# Patient Record
Sex: Male | Born: 1985
Health system: Southern US, Community
[De-identification: ages and names within clinical notes are randomized; demographics above are authoritative.]

## PROBLEM LIST (undated history)

## (undated) DIAGNOSIS — E785 Hyperlipidemia, unspecified: Secondary | ICD-10-CM

## (undated) DIAGNOSIS — E669 Obesity, unspecified: Secondary | ICD-10-CM

## (undated) DIAGNOSIS — F419 Anxiety disorder, unspecified: Secondary | ICD-10-CM

## (undated) DIAGNOSIS — F329 Major depressive disorder, single episode, unspecified: Secondary | ICD-10-CM

## (undated) DIAGNOSIS — I10 Essential (primary) hypertension: Secondary | ICD-10-CM

## (undated) DIAGNOSIS — F32A Depression, unspecified: Secondary | ICD-10-CM

## (undated) HISTORY — PX: OTHER SURGICAL HISTORY: SHX169

## (undated) HISTORY — PX: VASECTOMY: SHX75

## (undated) HISTORY — DX: Anxiety disorder, unspecified: F41.9

## (undated) HISTORY — DX: Depression, unspecified: F32.A

## (undated) HISTORY — DX: Obesity, unspecified: E66.9

## (undated) HISTORY — DX: Essential (primary) hypertension: I10

## (undated) HISTORY — DX: Hyperlipidemia, unspecified: E78.5

## (undated) HISTORY — DX: Major depressive disorder, single episode, unspecified: F32.9

---
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
## 2011-10-16 ENCOUNTER — Other Ambulatory Visit: Payer: Self-pay

## 2011-10-16 ENCOUNTER — Emergency Department (HOSPITAL_COMMUNITY)
Admission: EM | Admit: 2011-10-16 | Discharge: 2011-10-16 | Disposition: A | Discharge status: Home / Self Care | Payer: BC Managed Care – PPO | Attending: Emergency Medicine | Admitting: Emergency Medicine

## 2011-10-16 ENCOUNTER — Emergency Department (HOSPITAL_COMMUNITY): Payer: BC Managed Care – PPO

## 2011-10-16 ENCOUNTER — Encounter (HOSPITAL_COMMUNITY): Payer: Self-pay | Admitting: *Deleted

## 2011-10-16 DIAGNOSIS — IMO0002 Reserved for concepts with insufficient information to code with codable children: Secondary | ICD-10-CM | POA: Insufficient documentation

## 2011-10-16 DIAGNOSIS — W208XXA Other cause of strike by thrown, projected or falling object, initial encounter: Secondary | ICD-10-CM | POA: Insufficient documentation

## 2011-10-16 DIAGNOSIS — S29011A Strain of muscle and tendon of front wall of thorax, initial encounter: Secondary | ICD-10-CM

## 2011-10-16 DIAGNOSIS — R0602 Shortness of breath: Secondary | ICD-10-CM | POA: Insufficient documentation

## 2011-10-16 DIAGNOSIS — M546 Pain in thoracic spine: Secondary | ICD-10-CM | POA: Insufficient documentation

## 2011-10-16 DIAGNOSIS — R079 Chest pain, unspecified: Secondary | ICD-10-CM | POA: Insufficient documentation

## 2011-10-16 MED ORDER — HYDROCODONE-ACETAMINOPHEN 5-500 MG PO TABS: 1.0000 | ORAL_TABLET | Freq: Four times a day (QID) | ORAL | Status: DC | PRN

## 2011-10-16 MED ORDER — CYCLOBENZAPRINE HCL 10 MG PO TABS: 10.0000 mg | ORAL_TABLET | Freq: Two times a day (BID) | ORAL | Status: DC | PRN

## 2011-10-16 MED ORDER — IBUPROFEN 800 MG PO TABS: 800.0000 mg | ORAL_TABLET | Freq: Three times a day (TID) | ORAL | Status: DC

## 2011-10-16 NOTE — ED Notes (Signed)
EKG done and shown to Dr. Effie Shy upon arrival

## 2011-10-16 NOTE — ED Notes (Signed)
Patient complaining of chest pain and shortness of breath; taken to EKG room to perform EKG.

## 2011-10-16 NOTE — ED Provider Notes (Signed)
History     CSN: [id number]  Arrival date & time 10/16/11  1947   First MD Initiated Contact with Patient 10/16/11 2115      Chief Complaint  Patient presents with  . Shortness of Breath    (Consider location/radiation/quality/duration/timing/severity/associated sxs/prior treatment) Patient is a 26 y.o. male presenting with back pain. The history is provided by the patient and the spouse.  Back Pain  This is a new problem. The current episode started 3 to 5 hours ago. The problem occurs constantly. The problem has been gradually improving. Associated with: car falling on patient. The pain is present in the thoracic spine. The pain does not radiate. The pain is at a severity of 2/10. The pain is mild. The symptoms are aggravated by certain positions. The pain is the same all the time. Pertinent negatives include no chest pain, no fever, no numbness, no abdominal pain, no abdominal swelling, no bowel incontinence, no bladder incontinence and no weakness. He has tried nothing for the symptoms.    History reviewed. No pertinent past medical history.  History reviewed. No pertinent past surgical history.  History reviewed. No pertinent family history.  History  Substance Use Topics  . Smoking status: Never Smoker   . Smokeless tobacco: Not on file  . Alcohol Use: No      Review of Systems  Constitutional: Negative for fever and chills.  HENT: Negative for congestion.   Respiratory: Negative for cough and shortness of breath.   Cardiovascular: Negative for chest pain.  Gastrointestinal: Negative for nausea, vomiting, abdominal pain, constipation, blood in stool and bowel incontinence.  Genitourinary: Negative for bladder incontinence, decreased urine volume and difficulty urinating.  Musculoskeletal: Positive for back pain.  Skin: Negative for color change, pallor and wound.  Neurological: Negative for weakness and numbness.  Psychiatric/Behavioral: Negative for confusion.    All other systems reviewed and are negative.    Allergies  Review of patient's allergies indicates no known allergies.  Home Medications   Current Outpatient Rx  Name Route Sig Dispense Refill  . IBUPROFEN 200 MG PO TABS Oral Take 400 mg by mouth every 6 (six) hours as needed. For pain.      BP 124/93  Pulse 90  Temp(Src) 98.2 F (36.8 C) (Oral)  Resp 20  SpO2 98%  Physical Exam  Nursing note and vitals reviewed. Constitutional: He is oriented to person, place, and time. He appears well-developed and well-nourished.  HENT:  Head: Normocephalic and atraumatic.  Right Ear: External ear normal.  Left Ear: External ear normal.  Nose: Nose normal.  Neck: Neck supple.  Cardiovascular: Normal rate, regular rhythm, normal heart sounds and intact distal pulses.   Pulmonary/Chest: Effort normal and breath sounds normal. No respiratory distress. He has no wheezes. He has no rales. He exhibits no tenderness.  Abdominal: Soft. He exhibits no distension and no mass. There is no tenderness. There is no rebound and no guarding.  Musculoskeletal: He exhibits no edema.       Thoracic back: He exhibits tenderness (mild midline and bilateral soft tissue tenderness).  Lymphadenopathy:    He has no cervical adenopathy.  Neurological: He is alert and oriented to person, place, and time. He has normal strength. No sensory deficit.  Skin: Skin is warm and dry. No bruising and no ecchymosis noted.    ED Course  Procedures (including critical care time)  Labs Reviewed - No data to display Dg Chest 2 View  10/16/2011  *RADIOLOGY REPORT*  Clinical  Data: Shortness of breath  CHEST - 2 VIEW  Comparison: None.  Findings: Normal heart, mediastinal, and hilar contours.  The trachea is midline.  Lung volumes are slightly low.  The lungs are clear.  Negative for pleural effusion or pneumothorax.  The bony thorax is within normal limits.  IMPRESSION: No acute cardiopulmonary disease.  Original Report  Authenticated By: Britta Mccreedy, M.D.     1. Strain of chest wall   2. Back pain, thoracic       MDM  26 yo male presents after a car on a jack fell on top of his right abd and left chest. On him for a few min until wife could lift it off. Initially dyspnea and CP, but now those have resolved, only has midline thoracic pain. Pain has decreased just with time, and now is only a 2/10. Likely a strain, as mechanism is not c/w thoracic fracture. Chest, abd exam negative for tenderness or bruising. On my view of lateral CXR thoracic spine looks intact. Will d/c home with pain meds, precautions and follow up with PCP.        Pricilla Loveless, MD 10/16/11 [phone]

## 2011-10-16 NOTE — ED Notes (Signed)
The pt was working on his car lodged on some wood and the car  [place] onto his chest he was trapped until his wife came out and wound the car jack back up.  C/o chest back posterior chest  Pain since.  At present no acute distress

## 2011-10-16 NOTE — ED Notes (Signed)
Wife in waiting room asking about patient; patient informed that he can sit out in waiting room with wife while waiting on a room.  Will continue to monitor.

## 2011-10-17 NOTE — ED Provider Notes (Signed)
I saw and evaluated the patient, reviewed the resident's note and I agree with the findings and plan. Pt had car jack fail, vehicle briefly rested on chest wall. Breathing comfortable. Mild chest wall tenderness without crepitus. cxr neg. Spine nt.   Suzi Roots, MD 10/17/11 [phone]

## 2013-05-24 ENCOUNTER — Ambulatory Visit: Payer: Self-pay

## 2013-06-23 ENCOUNTER — Telehealth: Payer: Self-pay | Admitting: Nurse Practitioner

## 2013-06-24 ENCOUNTER — Encounter: Payer: Self-pay | Admitting: Family Medicine

## 2013-06-24 NOTE — Progress Notes (Signed)
  Subjective:    Patient ID: Todd Sandoval Comment, male    DOB: July 21, 1986, 27 y.o.   MRN: [id number]  HPI    Review of Systems     Objective:   Physical Exam        Assessment & Plan:

## 2013-09-22 ENCOUNTER — Encounter (INDEPENDENT_AMBULATORY_CARE_PROVIDER_SITE_OTHER): Payer: Self-pay

## 2013-09-22 ENCOUNTER — Encounter: Payer: Self-pay | Admitting: Physician Assistant

## 2013-09-22 ENCOUNTER — Ambulatory Visit (INDEPENDENT_AMBULATORY_CARE_PROVIDER_SITE_OTHER): Payer: BC Managed Care – PPO | Admitting: Physician Assistant

## 2013-09-22 VITALS — BP 131/87 | HR 115 | Temp 102.4°F | Ht 68.0 in | Wt 246.0 lb

## 2013-09-22 DIAGNOSIS — R059 Cough, unspecified: Secondary | ICD-10-CM

## 2013-09-22 DIAGNOSIS — R05 Cough: Secondary | ICD-10-CM

## 2013-09-22 LAB — POCT INFLUENZA A/B
Influenza A, POC: POSITIVE
Influenza B, POC: NEGATIVE

## 2013-09-22 MED ORDER — OSELTAMIVIR PHOSPHATE 75 MG PO CAPS: 75.0000 mg | ORAL_CAPSULE | Freq: Two times a day (BID) | ORAL | Status: DC

## 2013-09-22 MED ORDER — HYDROCODONE-HOMATROPINE 5-1.5 MG PO TABS: ORAL_TABLET | ORAL | Status: DC

## 2013-09-22 NOTE — Progress Notes (Signed)
   Subjective:    Patient ID: Todd Sandoval Commenthristopher A Lottes, male    DOB: 04/20/1986, 28 y.o.   MRN: [id number]  Cough Associated symptoms include chills, a fever, postnasal drip and a sore throat. Pertinent negatives include no shortness of breath or wheezing.  Fever  Associated symptoms include congestion, coughing (nonproductive) and a sore throat. Pertinent negatives include no wheezing.   28 y/o male presents with 2 days s/s of non productive cough, nasal congestion, fever and HA x 2 days. Has tried Theraflu with no relief. No known sick contacts.      Review of Systems  Constitutional: Positive for fever, chills, diaphoresis and fatigue.  HENT: Positive for congestion, postnasal drip, sinus pressure, sneezing and sore throat.   Respiratory: Positive for cough (nonproductive). Negative for shortness of breath and wheezing.   Cardiovascular: Negative.        Objective:   Physical Exam  Nursing note and vitals reviewed. Constitutional: He is oriented to person, place, and time. He appears well-developed and well-nourished.  HENT:  Right Ear: External ear normal.  Left Ear: External ear normal.  Bilateral nasal congestion   Cardiovascular: Normal rate, regular rhythm and normal heart sounds.   Pulmonary/Chest: Effort normal and breath sounds normal. No respiratory distress. He has no wheezes. He has no rales. He exhibits no tenderness.  Nonproductive cough  Neurological: He is alert and oriented to person, place, and time.  Skin: Skin is warm.          Assessment & Plan:  1. Flu A Positive: Patient has been symptomatic for over 48 hours so I discussed the possibility with him that the Tamiflu may not be effective d/t timeframe since onset. If the Patient is unsure of his insurance coverage so I have advised him to begin taking it if affordable. However, if not, he will need to treat symptomatically with Hycodan q 4-6 hrs and sudafed decongestant. Tylenol and Motrin for fever as  directed. Plenty of fluids and rest. RTC if SOB or s/s worsen.

## 2015-04-24 ENCOUNTER — Encounter: Payer: Self-pay | Admitting: Family Medicine

## 2015-04-24 ENCOUNTER — Ambulatory Visit (INDEPENDENT_AMBULATORY_CARE_PROVIDER_SITE_OTHER): Payer: BLUE CROSS/BLUE SHIELD | Admitting: Family Medicine

## 2015-04-24 ENCOUNTER — Encounter (INDEPENDENT_AMBULATORY_CARE_PROVIDER_SITE_OTHER): Payer: Self-pay

## 2015-04-24 VITALS — BP 136/84 | HR 84 | Temp 97.7°F | Ht 67.0 in | Wt 268.0 lb

## 2015-04-24 DIAGNOSIS — Z0000 Encounter for general adult medical examination without abnormal findings: Secondary | ICD-10-CM | POA: Diagnosis not present

## 2015-04-24 DIAGNOSIS — R0683 Snoring: Secondary | ICD-10-CM

## 2015-04-24 MED ORDER — PHENTERMINE HCL 37.5 MG PO CAPS: 37.5000 mg | ORAL_CAPSULE | ORAL | Status: DC

## 2015-04-24 NOTE — Progress Notes (Signed)
Subjective:  Patient ID: Todd Sandoval, male    DOB: 11-13-1985  Age: 29 y.o. MRN: [id number]  CC: Annual Exam   HPI Todd Sandoval presents for complete physical examination. He is very healthy overall but his father and grandfather both died of myocardial infarction. Father at age 30 grandfather at age 29. Patient is interested in managing his risk. History Todd Sandoval has a past medical history of Obesity.   He has past surgical history that includes none.   His family history includes Early death in his father and paternal grandfather; Heart disease in his father and paternal grandfather; Hyperlipidemia in his father.He reports that he has never smoked. He does not have any smokeless tobacco history on file. He reports that he does not drink alcohol. His drug history is not on file.    ROS Review of Systems  Constitutional: Negative for fever, chills, diaphoresis, activity change, appetite change, fatigue and unexpected weight change.  HENT: Negative for congestion, ear pain, hearing loss, postnasal drip, rhinorrhea, sore throat, tinnitus and trouble swallowing.   Eyes: Negative for photophobia, pain, discharge and redness.  Respiratory: Negative for apnea, cough, choking, chest tightness, shortness of breath, wheezing and stridor.   Cardiovascular: Negative for chest pain, palpitations and leg swelling.  Gastrointestinal: Negative for nausea, vomiting, abdominal pain, diarrhea, constipation, blood in stool and abdominal distention.  Endocrine: Negative for cold intolerance, heat intolerance, polydipsia, polyphagia and polyuria.  Genitourinary: Negative for dysuria, urgency, frequency, hematuria, flank pain, enuresis, difficulty urinating and genital sores.  Musculoskeletal: Negative for joint swelling and arthralgias.  Skin: Negative for color change, rash and wound.  Allergic/Immunologic: Negative for immunocompromised state.  Neurological: Negative for dizziness,  tremors, seizures, syncope, facial asymmetry, speech difficulty, weakness, light-headedness, numbness and headaches.  Hematological: Does not bruise/bleed easily.  Psychiatric/Behavioral: Negative for suicidal ideas, hallucinations, behavioral problems, confusion, sleep disturbance, dysphoric mood, decreased concentration and agitation. The patient is not nervous/anxious and is not hyperactive.     Objective:  BP 136/84 mmHg  Pulse 84  Temp(Src) 97.7 F (36.5 C) (Oral)  Ht $R'5\' 7"'rP$  (1.702 m)  Wt 268 lb (121.564 kg)  BMI 41.96 kg/m2  BP Readings from Last 3 Encounters:  04/24/15 136/84  09/22/13 131/87  10/16/11 141/88    Wt Readings from Last 3 Encounters:  04/24/15 268 lb (121.564 kg)  09/22/13 246 lb (111.585 kg)     Physical Exam  Constitutional: He is oriented to person, place, and time. He appears well-developed and well-nourished.  HENT:  Head: Normocephalic and atraumatic.  Mouth/Throat: Oropharynx is clear and moist.  Eyes: EOM are normal. Pupils are equal, round, and reactive to light.  Neck: Normal range of motion. No tracheal deviation present. No thyromegaly present.  Cardiovascular: Normal rate, regular rhythm and normal heart sounds.  Exam reveals no gallop and no friction rub.   No murmur heard. Pulmonary/Chest: Breath sounds normal. He has no wheezes. He has no rales.  Abdominal: Soft. He exhibits no mass. There is no tenderness.  Musculoskeletal: Normal range of motion. He exhibits no edema.  Neurological: He is alert and oriented to person, place, and time.  Skin: Skin is warm and dry.  Psychiatric: He has a normal mood and affect.    No results found for: HGBA1C  No results found for: WBC, HGB, HCT, PLT, GLUCOSE, CHOL, TRIG, HDL, LDLDIRECT, LDLCALC, ALT, AST, NA, K, CL, CREATININE, BUN, CO2, TSH, PSA, INR, GLUF, HGBA1C, MICROALBUR       Assessment & Plan:  Todd Sandoval was seen today for annual exam.  Diagnoses and all orders for this  visit:  Snoring -     Ambulatory referral to Sleep Studies  Wellness examination -     CBC with Differential/Platelet -     CMP14+EGFR -     Lipid panel -     TSH  Other orders -     phentermine 37.5 MG capsule; Take 1 capsule (37.5 mg total) by mouth every morning.   I have discontinued Mr. Kotowski Hydrocodone-Homatropine and oseltamivir. I am also having him start on phentermine. Additionally, I am having him maintain his multivitamin with minerals.  Meds ordered this encounter  Medications  . Multiple Vitamins-Minerals (MULTIVITAMIN WITH MINERALS) tablet    Sig: Take 1 tablet by mouth daily.  . phentermine 37.5 MG capsule    Sig: Take 1 capsule (37.5 mg total) by mouth every morning.    Dispense:  30 capsule    Refill:  2     Follow-up: Return in about 3 months (around 07/24/2015) for weight.  Claretta Fraise, M.D.

## 2015-04-24 NOTE — Patient Instructions (Signed)

## 2015-04-25 LAB — CMP14+EGFR
ALK PHOS: 62 IU/L (ref 39–117)
ALT: 82 IU/L — AB (ref 0–44)
AST: 33 IU/L (ref 0–40)
Albumin/Globulin Ratio: 1.4 (ref 1.1–2.5)
Albumin: 4.1 g/dL (ref 3.5–5.5)
BUN/Creatinine Ratio: 18 (ref 8–19)
BUN: 14 mg/dL (ref 6–20)
Bilirubin Total: 0.7 mg/dL (ref 0.0–1.2)
CALCIUM: 9.4 mg/dL (ref 8.7–10.2)
CO2: 23 mmol/L (ref 18–29)
CREATININE: 0.76 mg/dL (ref 0.76–1.27)
Chloride: 99 mmol/L (ref 97–108)
GFR calc Af Amer: 145 mL/min/{1.73_m2} (ref >59)
GFR, EST NON AFRICAN AMERICAN: 125 mL/min/{1.73_m2} (ref >59)
GLUCOSE: 104 mg/dL — AB (ref 65–99)
Globulin, Total: 2.9 g/dL (ref 1.5–4.5)
Potassium: 4.3 mmol/L (ref 3.5–5.2)
Sodium: 139 mmol/L (ref 134–144)
Total Protein: 7 g/dL (ref 6.0–8.5)

## 2015-04-25 LAB — LIPID PANEL
CHOL/HDL RATIO: 5.9 ratio — AB (ref 0.0–5.0)
CHOLESTEROL TOTAL: 190 mg/dL (ref 100–199)
HDL: 32 mg/dL — AB (ref >39)
LDL CALC: 123 mg/dL — AB (ref 0–99)
TRIGLYCERIDES: 173 mg/dL — AB (ref 0–149)
VLDL CHOLESTEROL CAL: 35 mg/dL (ref 5–40)

## 2015-04-25 LAB — CBC WITH DIFFERENTIAL/PLATELET
BASOS ABS: 0.1 10*3/uL (ref 0.0–0.2)
Basos: 1 %
EOS (ABSOLUTE): 0.6 10*3/uL — ABNORMAL HIGH (ref 0.0–0.4)
EOS: 10 %
Hematocrit: 45.5 % (ref 37.5–51.0)
Hemoglobin: 16.1 g/dL (ref 12.6–17.7)
Immature Grans (Abs): 0 10*3/uL (ref 0.0–0.1)
Immature Granulocytes: 0 %
LYMPHS ABS: 2.3 10*3/uL (ref 0.7–3.1)
Lymphs: 38 %
MCH: 30.6 pg (ref 26.6–33.0)
MCHC: 35.4 g/dL (ref 31.5–35.7)
MCV: 86 fL (ref 79–97)
MONOCYTES: 9 %
MONOS ABS: 0.5 10*3/uL (ref 0.1–0.9)
NEUTROS PCT: 42 %
Neutrophils Absolute: 2.6 10*3/uL (ref 1.4–7.0)
Platelets: 260 10*3/uL (ref 150–379)
RBC: 5.27 x10E6/uL (ref 4.14–5.80)
RDW: 13.4 % (ref 12.3–15.4)
WBC: 6.1 10*3/uL (ref 3.4–10.8)

## 2015-04-25 LAB — TSH: TSH: 2.13 u[IU]/mL (ref 0.450–4.500)

## 2015-04-26 ENCOUNTER — Encounter: Payer: Self-pay | Admitting: *Deleted

## 2015-04-30 ENCOUNTER — Telehealth: Payer: Self-pay

## 2015-04-30 NOTE — Telephone Encounter (Signed)
Insurance approved Phentermine through 07/24/15

## 2015-07-30 ENCOUNTER — Ambulatory Visit: Payer: BLUE CROSS/BLUE SHIELD | Admitting: Family Medicine

## 2015-08-01 ENCOUNTER — Encounter: Payer: Self-pay | Admitting: Family Medicine

## 2017-01-07 ENCOUNTER — Encounter: Payer: Self-pay | Admitting: Family Medicine

## 2017-01-07 ENCOUNTER — Ambulatory Visit (INDEPENDENT_AMBULATORY_CARE_PROVIDER_SITE_OTHER): Payer: BLUE CROSS/BLUE SHIELD | Admitting: Family Medicine

## 2017-01-07 VITALS — BP 129/88 | HR 100 | Temp 97.9°F | Ht 67.0 in | Wt 275.2 lb

## 2017-01-07 DIAGNOSIS — Z0000 Encounter for general adult medical examination without abnormal findings: Secondary | ICD-10-CM | POA: Diagnosis not present

## 2017-01-07 DIAGNOSIS — F339 Major depressive disorder, recurrent, unspecified: Secondary | ICD-10-CM

## 2017-01-07 MED ORDER — BUPROPION HCL ER (SR) 150 MG PO TB12: 150.0000 mg | ORAL_TABLET | Freq: Two times a day (BID) | ORAL | 2 refills | Status: DC

## 2017-01-07 NOTE — Progress Notes (Signed)
   HPI  Patient presents today here for an annual physical exam and to discuss depression.  Depression 2 years approximately, after his father's death. Has periods of crying spells, anhedonia, feelings of depression. Has had passive suicidal thoughts, contracts for safety and states that he would never hurt himself.  Patient has recently started working out multiple times a week. There are also watching his diet carefully. He has regular lab work done at work.  He feels well today and has no other complaints except for his mood.  PMH: Smoking status noted ROS: Per HPI  Objective: BP 129/88   Pulse 100   Temp 97.9 F (36.6 C) (Oral)   Ht 5\' 7"  (1.702 m)   Wt 275 lb 3.2 oz (124.8 kg)   BMI 43.10 kg/m  Gen: NAD, alert, cooperative with exam HEENT: NCAT CV: RRR, good S1/S2, no murmur Resp: CTABL, no wheezes, non-labored Abd: SNTND, BS present, no guarding or organomegaly Ext: No edema, warm Neuro: Alert and oriented, No gross deficits Psych:  Depressed mood, tearful during exam, depressed affect  Depression screen [facility] 2/9 01/07/2017 04/24/2015  Decreased Interest 2 1  Down, Depressed, Hopeless 2 1  PHQ - 2 Score 4 2  Altered sleeping 1 0  Tired, decreased energy 2 0  Change in appetite 1 0  Feeling bad or failure about yourself  2 0  Trouble concentrating 1 0  Moving slowly or fidgety/restless 0 0  Suicidal thoughts 1 0  PHQ-9 Score 12 2     Assessment and plan:  # Annual physical exam Normal exam except for obesity, patient has made positive therapeutic lifestyle changes recently. Congratulated on that. Follow-up 3-4 weeks Pt will bring in labs  # And depression Start Wellbutrin Recommended counseling, he will consider Follow-up in 3-4 weeks     Meds ordered this encounter  Medications  . buPROPion (WELLBUTRIN SR) 150 MG 12 hr tablet    Sig: Take 1 tablet (150 mg total) by mouth 2 (two) times daily.    Dispense:  60 tablet    Refill:  2    Murtis SinkSam  Dezarae Mcclaran, MD [facility] 01/07/2017, 2:58 PM

## 2017-01-07 NOTE — Patient Instructions (Signed)
Great to see yoU!  Start wellbutrin 1 pill daily for 3 days then 1 pill twice daily.   Come back in 3-4 weeks to check up

## 2017-05-11 ENCOUNTER — Other Ambulatory Visit: Payer: Self-pay | Admitting: Family Medicine

## 2017-05-11 MED ORDER — AMOXICILLIN 500 MG PO CAPS: 500.0000 mg | ORAL_CAPSULE | Freq: Two times a day (BID) | ORAL | 0 refills | Status: DC

## 2017-05-11 NOTE — Progress Notes (Signed)
Patient's daughter, Maxie Better, was diagnosed with strep pharyngitis today. He has an upcoming trip to the [place] via cruise and is worried that he may develop symptoms. He is currently completely asymptomatic. No known drug allergies. No history of recurrent strep throat. Amoxicillin 500 mg twice a day 10 days sent to his pharmacy to use should he develop symptoms during his cruise. Care was discussed with his primary care doctor Dr. Ermalinda Memos.  Pearle Wandler M. Nadine Counts, DO [facility]

## 2017-06-11 ENCOUNTER — Other Ambulatory Visit: Payer: Self-pay | Admitting: Family Medicine

## 2017-07-06 ENCOUNTER — Telehealth: Payer: Self-pay | Admitting: *Deleted

## 2017-07-06 NOTE — Telephone Encounter (Signed)
TC request RF Wellbutrin NTBS last OV 01/07/17 stated NTBS in about 4 wks Sent denial to [facility] as well

## 2017-07-07 ENCOUNTER — Telehealth: Payer: Self-pay

## 2017-07-07 ENCOUNTER — Other Ambulatory Visit: Payer: Self-pay

## 2017-07-07 MED ORDER — BUPROPION HCL ER (SR) 150 MG PO TB12: 150.0000 mg | ORAL_TABLET | Freq: Two times a day (BID) | ORAL | 0 refills | Status: DC

## 2017-07-07 NOTE — Telephone Encounter (Signed)
x

## 2017-07-16 ENCOUNTER — Other Ambulatory Visit: Payer: Self-pay | Admitting: Family Medicine

## 2017-07-29 ENCOUNTER — Other Ambulatory Visit: Payer: Self-pay | Admitting: *Deleted

## 2017-07-29 MED ORDER — BUPROPION HCL ER (SR) 150 MG PO TB12: 150.0000 mg | ORAL_TABLET | Freq: Two times a day (BID) | ORAL | 3 refills | Status: DC

## 2017-08-24 ENCOUNTER — Encounter: Payer: Self-pay | Admitting: Family Medicine

## 2017-08-24 ENCOUNTER — Ambulatory Visit: Payer: BLUE CROSS/BLUE SHIELD | Admitting: Family Medicine

## 2017-08-24 VITALS — BP 157/97 | HR 97 | Temp 98.2°F | Ht 67.0 in | Wt 281.2 lb

## 2017-08-24 DIAGNOSIS — M722 Plantar fascial fibromatosis: Secondary | ICD-10-CM

## 2017-08-24 MED ORDER — INDOMETHACIN 50 MG PO CAPS: 50.0000 mg | ORAL_CAPSULE | Freq: Two times a day (BID) | ORAL | 0 refills | Status: DC

## 2017-08-24 NOTE — Progress Notes (Signed)
   HPI  Patient presents today here for heel pain.  Patient states that he has had right heel pain for about 1 month. She states that it is worse after sitting down for a while.  He states that it hurts in the posterior part of this foot. 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  He has not tried medications, ice. He has tried heat without improvement  PMH: Smoking status noted ROS: Per HPI  Objective: BP (!) 157/97   Pulse 97   Temp 98.2 F (36.8 C) (Oral)   Ht 5\' 7"  (1.702 m)   Wt 281 lb 3.2 oz (127.6 kg)   BMI 44.04 kg/m  Gen: NAD, alert, cooperative with exam HEENT: NCAT CV: RRR, good S1/S2, no murmur Resp: CTABL, no wheezes, non-labored Ext: No edema, warm Neuro: Alert and oriented, No gross deficits  MSK: Tenderness to palpation in the right foot over the insertion of the right plantar fascia   Assessment and plan:  #Plantar fasciitis Treat conservatively Handout from sports medicine patient advisor given Indomethacin Return to clinic as needed  Meds ordered this encounter  Medications  . indomethacin (INDOCIN) 50 MG capsule    Sig: Take 1 capsule (50 mg total) by mouth 2 (two) times daily with a meal.    Dispense:  28 capsule    Refill:  0    Murtis SinkSam Richel Millspaugh, MD [facility] 08/24/2017, 4:45 PM

## 2017-08-24 NOTE — Patient Instructions (Signed)
Great to see you!  Read through the handout I gave you, the medicine, ice, and stretching are the most effective things you can try.

## 2017-09-04 ENCOUNTER — Other Ambulatory Visit: Payer: Self-pay | Admitting: Family Medicine

## 2017-09-20 ENCOUNTER — Other Ambulatory Visit: Payer: Self-pay | Admitting: Family Medicine

## 2017-09-29 ENCOUNTER — Ambulatory Visit: Payer: BLUE CROSS/BLUE SHIELD | Admitting: Nurse Practitioner

## 2017-09-29 ENCOUNTER — Encounter: Payer: Self-pay | Admitting: Nurse Practitioner

## 2017-09-29 VITALS — BP 104/68 | HR 106 | Temp 96.4°F | Ht 67.0 in | Wt 269.0 lb

## 2017-09-29 DIAGNOSIS — K529 Noninfective gastroenteritis and colitis, unspecified: Secondary | ICD-10-CM | POA: Diagnosis not present

## 2017-09-29 DIAGNOSIS — R52 Pain, unspecified: Secondary | ICD-10-CM | POA: Diagnosis not present

## 2017-09-29 LAB — VERITOR FLU A/B WAIVED
INFLUENZA A: NEGATIVE
INFLUENZA B: NEGATIVE

## 2017-09-29 MED ORDER — ONDANSETRON 4 MG PO TBDP: 4.0000 mg | ORAL_TABLET | Freq: Three times a day (TID) | ORAL | 0 refills | Status: DC | PRN

## 2017-09-29 NOTE — Progress Notes (Signed)
   Subjective:    Patient ID: Todd Sandoval, male    DOB: 10/30/1985, 32 y.o.   MRN: [id number]  HPI  Patient comes in today c/o vomiting and diarrhea. Started all the suddden at 3am. Can't keep anything down. Feels terrible. He has voided everytime he had diarrhea today.   Review of Systems  Constitutional: Appetite change: decreased.  HENT: Negative.   Cardiovascular: Negative.   Gastrointestinal: Positive for diarrhea, nausea and vomiting.  Genitourinary: Negative.   Neurological: Negative.   Psychiatric/Behavioral: Negative.   All other systems reviewed and are negative.      Objective:   Physical Exam  Constitutional: He is oriented to person, place, and time. He appears well-developed and well-nourished. He appears distressed (moderate).  HENT:  Mouth/Throat: Oropharynx is clear and moist.  Cardiovascular: Normal rate and regular rhythm.  Pulmonary/Chest: Effort normal and breath sounds normal.  Abdominal: Soft. Bowel sounds are normal.  Neurological: He is alert and oriented to person, place, and time.  Skin: There is pallor.  Psychiatric: He has a normal mood and affect. His behavior is normal. Judgment and thought content normal.   BP 104/68   Pulse (!) 106   Temp (!) 96.4 F (35.8 C) (Oral)   Ht 5\' 7"  (1.702 m)   Wt 269 lb (122 kg)   BMI 42.13 kg/m   Flu negative       Assessment & Plan:   1. Body aches   2. Gastroenteritis    Meds ordered this encounter  Medications  . ondansetron (ZOFRAN ODT) 4 MG disintegrating tablet    Sig: Take 1 tablet (4 mg total) by mouth every 8 (eight) hours as needed for nausea or vomiting.    Dispense:  20 tablet    Refill:  0    Order Specific Question:   Supervising Provider    Answer:   Oswaldo DoneVINCENT, CAROL L [[id number]]  take imodium about 20 min after taking zofran First 24 Hours-Clear liquids  popsicles  Jello  gatorade  Sprite Second 24 hours-Add Full liquids ( Liquids you cant see through) Third 24 hours- Bland  diet ( foods that are baked or broiled)  *avoiding fried foods and highly spiced foods* During these 3 days  Avoid milk, cheese, ice cream or any other dairy products  Avoid caffeine- REMEMBER Mt. Dew and Mello Yellow contain lots of caffeine You should eat and drink in  Frequent small volumes If no improvement in symptoms or worsen in 2-3 days should RETRUN TO OFFICE or go to ER!    Mary-Margaret Daphine DeutscherMartin, FNP

## 2017-09-29 NOTE — Patient Instructions (Signed)

## 2017-10-08 ENCOUNTER — Ambulatory Visit: Payer: BLUE CROSS/BLUE SHIELD | Admitting: Family Medicine

## 2017-10-20 ENCOUNTER — Encounter: Payer: Self-pay | Admitting: Family Medicine

## 2017-10-20 ENCOUNTER — Ambulatory Visit: Payer: BLUE CROSS/BLUE SHIELD | Admitting: Family Medicine

## 2017-10-20 VITALS — BP 132/78 | HR 86 | Temp 100.8°F | Ht 67.0 in | Wt 268.0 lb

## 2017-10-20 DIAGNOSIS — J020 Streptococcal pharyngitis: Secondary | ICD-10-CM

## 2017-10-20 LAB — RAPID STREP SCREEN (MED CTR MEBANE ONLY): Strep Gp A Ag, IA W/Reflex: POSITIVE — AB

## 2017-10-20 MED ORDER — AMOXICILLIN 500 MG PO TABS: 500.0000 mg | ORAL_TABLET | Freq: Two times a day (BID) | ORAL | 0 refills | Status: DC

## 2017-10-20 NOTE — Progress Notes (Signed)
BP 132/78   Pulse 86   Temp (!) 100.8 F (38.2 C) (Oral)   Ht 5\' 7"  (1.702 m)   Wt 268 lb (121.6 kg)   BMI 41.97 kg/m    Subjective:    Patient ID: Todd Sandoval, male    DOB: 02/02/1986, 32 y.o.   MRN: [id number]  HPI: Todd Sandoval is a 32 y.o. male presenting on 10/20/2017 for Sore throat, fever (sore throat since last week, fever began last night, 102.0) and Diarrhea, nauseous (since yesterday morning, noticed blood in stool twice; has tried Pepto Bismol and Immodium)   HPI Pt presents with sore throat and fever of 102F since last night. Also stated diarrhea and nausea began last night also. Denies any vomiting. Says he had 18-20 episodes of very watery diarrhea and 2 of them had blood in them. His fever is down to 100.67F today after taking Tylenol. Also has taken Pepto Bismol and Imodium which has seemed to calm down the diarrhea as it is slightly improving today. His wife and daughter were also sick with a similar gastrointestinal illness causing diarrhea and stomach upset a day prior to his illness after eating at [employer] that has resolved. He feels his course of illness is much worse.   Relevant past medical, surgical, family and social history reviewed and updated as indicated. Interim medical history since our last visit reviewed. Allergies and medications reviewed and updated.  Review of Systems  Constitutional: Positive for appetite change, chills, fatigue and fever.  HENT: Positive for sore throat. Negative for congestion, ear discharge, ear pain, rhinorrhea, sinus pressure and sinus pain.   Eyes: Negative for pain and itching.  Respiratory: Negative for cough, shortness of breath and wheezing.   Gastrointestinal: Positive for abdominal pain, blood in stool (2x since last night), diarrhea (18-20 episodes over last 24 hours) and nausea. Negative for constipation and vomiting.  Genitourinary: Negative for difficulty urinating, dysuria, flank pain and  frequency.  Neurological: Negative for headaches.    Per HPI unless specifically indicated above   Allergies as of 10/20/2017   No Known Allergies     Medication List        Accurate as of 10/20/17  2:51 PM. Always use your most recent med list.          amoxicillin 500 MG tablet Commonly known as:  AMOXIL Take 1 tablet (500 mg total) by mouth 2 (two) times daily.   buPROPion 150 MG 12 hr tablet Commonly known as:  WELLBUTRIN SR Take 1 tablet (150 mg total) by mouth 2 (two) times daily.          Objective:    BP 132/78   Pulse 86   Temp (!) 100.8 F (38.2 C) (Oral)   Ht 5\' 7"  (1.702 m)   Wt 268 lb (121.6 kg)   BMI 41.97 kg/m   Wt Readings from Last 3 Encounters:  10/20/17 268 lb (121.6 kg)  09/29/17 269 lb (122 kg)  08/24/17 281 lb 3.2 oz (127.6 kg)    Physical Exam  Constitutional: He appears well-developed and well-nourished.  HENT:  Nose: Nose normal.  Mouth/Throat: Uvula is midline and mucous membranes are normal. Posterior oropharyngeal edema and posterior oropharyngeal erythema present.  Cardiovascular: Normal rate, regular rhythm and normal heart sounds.  Pulmonary/Chest: Effort normal and breath sounds normal. No respiratory distress. He has no wheezes. He has no rales.  Abdominal: Soft. Normal appearance. There is tenderness (mild diffuse abdominal tenderness). There is no  rigidity and no rebound.  Lymphadenopathy:    He has cervical adenopathy.       Right cervical: Superficial cervical (small lymph node present; tenderness noted) adenopathy present.  Vitals reviewed.   Rapid strep: positive    Assessment & Plan:   Problem List Items Addressed This Visit    None    Visit Diagnoses    Strep pharyngitis    -  Primary   Relevant Medications   amoxicillin (AMOXIL) 500 MG tablet   Other Relevant Orders   Rapid Strep Screen (Not at [facility])      Order Amoxicillin for strep pharyngitis. Encourage fluid intake for diarrhea and call if symptoms  worsen.  Follow up plan: Return if symptoms worsen or fail to improve.  Counseling provided for all of the vaccine components Orders Placed This Encounter  Procedures  . Rapid Strep Screen (Not at [facility])    Patient seen and examined with Franco Collet, PA student. Agree with assessment and plan above.  Arville Care, MD [facility] 10/22/2017, 2:12 PM

## 2017-11-30 ENCOUNTER — Other Ambulatory Visit: Payer: Self-pay | Admitting: Family Medicine

## 2018-03-15 ENCOUNTER — Encounter: Payer: Self-pay | Admitting: Family Medicine

## 2018-03-15 ENCOUNTER — Ambulatory Visit (INDEPENDENT_AMBULATORY_CARE_PROVIDER_SITE_OTHER): Payer: BLUE CROSS/BLUE SHIELD | Admitting: Family Medicine

## 2018-03-15 VITALS — BP 135/84 | HR 92 | Temp 97.5°F | Ht 67.0 in | Wt 272.8 lb

## 2018-03-15 DIAGNOSIS — L821 Other seborrheic keratosis: Secondary | ICD-10-CM

## 2018-03-15 DIAGNOSIS — Z0000 Encounter for general adult medical examination without abnormal findings: Secondary | ICD-10-CM | POA: Diagnosis not present

## 2018-03-15 DIAGNOSIS — L819 Disorder of pigmentation, unspecified: Secondary | ICD-10-CM

## 2018-03-15 DIAGNOSIS — Z130 Encounter for screening for diseases of the blood and blood-forming organs and certain disorders involving the immune mechanism: Secondary | ICD-10-CM

## 2018-03-15 DIAGNOSIS — H6121 Impacted cerumen, right ear: Secondary | ICD-10-CM

## 2018-03-15 DIAGNOSIS — Z13228 Encounter for screening for other metabolic disorders: Secondary | ICD-10-CM

## 2018-03-15 DIAGNOSIS — Z808 Family history of malignant neoplasm of other organs or systems: Secondary | ICD-10-CM

## 2018-03-15 LAB — BAYER DCA HB A1C WAIVED: HB A1C (BAYER DCA - WAIVED): 5.3 % (ref <7.0)

## 2018-03-15 NOTE — Progress Notes (Signed)
Todd Sandoval is a 32 y.o. male presents to office today for annual physical exam examination.    Concerns today include: He needs forms completed for Todd Sandoval.   1. Skin lesions Patient is accompanied to the office by his wife, who notes that he has had multiple moles that are regular appearing.  She denies any change in shape, color, size.  No spontaneous bleeding but often whenever he is getting a haircut the one on his scalp does get cut and bleeds.  Patient does have a family history of skin cancer in his father.  He is unsure as to what kind of skin cancer but notes that he required resection.  He has not seen a dermatologist.  Occupation: Works on Scientist, research (medical) at [employer]; currently works night shift, Marital status: Married, Substance use: None Diet: Does eat quite a bit of vegetables rarely any red meat. Exercise: No structured but he does walk quite a bit at work. Immunizations needed: Up-to-date  Past Medical History:  Diagnosis Date  . Obesity    Social History   Socioeconomic History  . Marital status: Married    Spouse name: Not on file  . Number of children: 4  . Years of education: Not on file  . Highest education level: Not on file  Occupational History    Employer: [employer]    Comment: works on [place]  . Financial resource strain: Not on file  . Food insecurity:    Worry: Not on file    Inability: Not on file  . Transportation needs:    Medical: Not on file    Non-medical: Not on file  Tobacco Use  . Smoking status: Never Smoker  . Smokeless tobacco: Never Used  Substance and Sexual Activity  . Alcohol use: No  . Drug use: Not on file  . Sexual activity: Yes  Lifestyle  . Physical activity:    Days per week: Not on file    Minutes per session: Not on file  . Stress: Not on file  Relationships  . Social connections:    Talks on phone: Not on file    Gets together: Not on file    Attends religious  service: Not on file    Active member of club or organization: Not on file    Attends meetings of clubs or organizations: Not on file    Relationship status: Not on file  . Intimate partner violence:    Fear of current or ex partner: Not on file    Emotionally abused: Not on file    Physically abused: Not on file    Forced sexual activity: Not on file  Other Topics Concern  . Not on file  Social History Narrative  . Not on file   Past Surgical History:  Procedure Laterality Date  . none     Family History  Problem Relation Age of Onset  . Hyperlipidemia Father   . Early death Father   . Heart disease Father   . Skin cancer Father   . Early death Paternal Grandfather   . Heart disease Paternal Grandfather    No current outpatient medications on file.  No Known Allergies   ROS: Review of Systems Constitutional: negative Eyes: negative Ears, nose, mouth, throat, and face: negative Respiratory: negative Cardiovascular: negative Gastrointestinal: negative Genitourinary:negative Integument/breast: negative Hematologic/lymphatic: positive for multiple nevi/ moles Musculoskeletal:positive for occasional achy joints. Neurological: negative Behavioral/Psych: negative Endocrine: negative Allergic/Immunologic: negative  Physical exam BP 136/90   Pulse 92   Temp (!) 97.5 F (36.4 C) (Oral)   Ht '5\' 7"'$  (1.702 m)   Wt 272 lb 12.8 oz (123.7 kg)   BMI 42.73 kg/m  General appearance: alert, cooperative, appears stated age, no distress and mildly obese Head: Normocephalic, without obvious abnormality, atraumatic Eyes: negative findings: lids and lashes normal, conjunctivae and sclerae normal, corneas clear and pupils equal, round, reactive to light and accomodation Ears: right ear with cerumen obscuring TM. Left TM normal. Nose: Nares normal. Septum midline. Mucosa normal. No drainage or sinus tenderness. Throat: lips, mucosa, and tongue normal; teeth and gums  normal Neck: no adenopathy, supple, symmetrical, trachea midline and thyroid not enlarged, symmetric, no tenderness/mass/nodules Back: symmetric, no curvature. ROM normal. No CVA tenderness. Lungs: clear to auscultation bilaterally Chest wall: no tenderness Heart: regular rate and rhythm, S1, S2 normal, no murmur, click, rub or gallop Abdomen: soft, non-tender; bowel sounds normal; no masses,  no organomegaly Extremities: extremities normal, atraumatic, no cyanosis or edema Pulses: 2+ and symmetric Skin: Skin color, texture, turgor normal. No rashes or lesions or Multiple pigmented nevi appreciated along the back and neck.  He has several seborrheic keratosis along the right upper back.  He has a 4 mm x 4 mm roughly textured lesion appreciated along the right posterior scalp.  There is a similar lesion on the left nape of his neck posteriorly. Lymph nodes: Cervical, supraclavicular, and axillary nodes normal. Neurologic: Grossly normal  Psych: mood stable, speech normal. Depression screen [facility] 2/9 03/15/2018 10/20/2017 10/20/2017  Decreased Interest 0 0 0  Down, Depressed, Hopeless 0 1 1  PHQ - 2 Score 0 1 1  Altered sleeping - - -  Tired, decreased energy - - -  Change in appetite - - -  Feeling bad or failure about yourself  - - -  Trouble concentrating - - -  Moving slowly or fidgety/restless - - -  Suicidal thoughts - - -  PHQ-9 Score - - -    Assessment/ Plan: Todd Sandoval here for annual physical exam.   1. Annual physical exam  2. Morbid obesity ([place]) We discussed conscious eating, incorporating more physical activity.  Handout was provided.  I encouraged him to check out the my [email or web address] website.  We will continue to address with each visit.  I do think that night shift impacts his eating habits.  Hopefully once he is switched over to dayshift, this will improve.  Will check metabolic labs to rule out metabolic etiology. - TSH - CMP14+EGFR - Bayer DCA Hb A1c Waived -  Lipid Panel  3. Screening for deficiency anemia - CBC with Differential  4. Screening for metabolic disorder - MGQ67+YPPJ  5. Pigmented skin lesions Likely benign nevi but patient Fitzpatrick 1.5 on exam and with family history of unknown skin cancer would likely warrant referral to dermatology for skin survey. - Ambulatory referral to Dermatology  6. Seborrheic keratoses - Ambulatory referral to Dermatology  7. Family history of skin cancer - Ambulatory referral to Dermatology  8. Impacted cerumen of right ear Likely from use of hearing protection at work.  This was irrigated during today's visit.   Counseled on healthy lifestyle choices, including diet (rich in fruits, vegetables and lean meats and low in salt and simple carbohydrates) and exercise (at least 30 minutes of moderate physical activity daily).  Patient to follow up in 1 year for annual exam or sooner if needed.  Ashly M. Lajuana Ripple, DO

## 2018-03-15 NOTE — Patient Instructions (Addendum)
You had labs performed today.  You will be contacted with the results of the labs once they are available, usually in the next 3 business days for routine lab work.    Seborrheic Keratosis Seborrheic keratosis is a common, noncancerous (benign) skin growth. This condition causes waxy, rough, tan, brown, or black spots to appear on the skin. These skin growths can be flat or raised. What are the causes? The cause of this condition is not known. What increases the risk? This condition is more likely to develop in:  People who have a family history of seborrheic keratosis.  People who are 4550 or older.  People who are pregnant.  People who have had estrogen replacement therapy.  What are the signs or symptoms? This condition often occurs on the face, chest, shoulders, back, or other areas. These growths:  Are usually painless, but may become irritated and itchy.  Can be yellow, brown, black, or other colors.  Are slightly raised or have a flat surface.  Are sometimes rough or wart-like in texture.  Are often waxy on the surface.  Are round or oval-shaped.  Sometimes look like they are "stuck on."  Often occur in groups, but may occur as a single growth.  How is this diagnosed? This condition is diagnosed with a medical history and physical exam. A sample of the growth may be tested (skin biopsy). You may need to see a skin specialist (dermatologist). How is this treated? Treatment is not usually needed for this condition, unless the growths are irritated or are often bleeding. You may also choose to have the growths removed if you do not like their appearance. Most commonly, these growths are treated with a procedure in which liquid nitrogen is applied to "freeze" off the growth (cryosurgery). They may also be burned off with electricity or cut off. Follow these instructions at home:  Watch your growth for any changes.  Keep all follow-up visits as told by your health care  provider. This is important.  Do not scratch or pick at the growth or growths. This can cause them to become irritated or infected. Contact a health care provider if:  You suddenly have many new growths.  Your growth bleeds, itches, or hurts.  Your growth suddenly becomes larger or changes color. This information is not intended to replace advice given to you by your health care provider. Make sure you discuss any questions you have with your health care provider. Document Released: 08/22/2010 Document Revised: 12/26/2015 Document Reviewed: 12/05/2014 Elsevier Interactive Patient Education  2018 [employer]

## 2018-03-16 LAB — CBC WITH DIFFERENTIAL/PLATELET
Basophils Absolute: 0 x10E3/uL (ref 0.0–0.2)
Basos: 1 %
EOS (ABSOLUTE): 0.4 x10E3/uL (ref 0.0–0.4)
Eos: 7 %
Hematocrit: 45 % (ref 37.5–51.0)
Hemoglobin: 15.4 g/dL (ref 13.0–17.7)
Immature Grans (Abs): 0 x10E3/uL (ref 0.0–0.1)
Immature Granulocytes: 0 %
Lymphocytes Absolute: 2.6 x10E3/uL (ref 0.7–3.1)
Lymphs: 47 %
MCH: 29.2 pg (ref 26.6–33.0)
MCHC: 34.2 g/dL (ref 31.5–35.7)
MCV: 85 fL (ref 79–97)
Monocytes Absolute: 0.3 x10E3/uL (ref 0.1–0.9)
Monocytes: 6 %
Neutrophils Absolute: 2.2 x10E3/uL (ref 1.4–7.0)
Neutrophils: 39 %
Platelets: 249 x10E3/uL (ref 150–450)
RBC: 5.28 x10E6/uL (ref 4.14–5.80)
RDW: 13.4 % (ref 12.3–15.4)
WBC: 5.5 x10E3/uL (ref 3.4–10.8)

## 2018-03-16 LAB — CMP14+EGFR
ALT: 55 IU/L — ABNORMAL HIGH (ref 0–44)
AST: 25 IU/L (ref 0–40)
Albumin/Globulin Ratio: 1.4 (ref 1.2–2.2)
Albumin: 4.2 g/dL (ref 3.5–5.5)
Alkaline Phosphatase: 65 IU/L (ref 39–117)
BUN/Creatinine Ratio: 18 (ref 9–20)
BUN: 15 mg/dL (ref 6–20)
Bilirubin Total: 1 mg/dL (ref 0.0–1.2)
CO2: 23 mmol/L (ref 20–29)
Calcium: 9.6 mg/dL (ref 8.7–10.2)
Chloride: 104 mmol/L (ref 96–106)
Creatinine, Ser: 0.85 mg/dL (ref 0.76–1.27)
GFR calc Af Amer: 135 mL/min/{1.73_m2} (ref >59)
GFR calc non Af Amer: 117 mL/min/{1.73_m2} (ref >59)
Globulin, Total: 3 g/dL (ref 1.5–4.5)
Glucose: 91 mg/dL (ref 65–99)
Potassium: 4.3 mmol/L (ref 3.5–5.2)
Sodium: 141 mmol/L (ref 134–144)
Total Protein: 7.2 g/dL (ref 6.0–8.5)

## 2018-03-16 LAB — LIPID PANEL
CHOLESTEROL TOTAL: 198 mg/dL (ref 100–199)
Chol/HDL Ratio: 7.1 ratio — ABNORMAL HIGH (ref 0.0–5.0)
HDL: 28 mg/dL — ABNORMAL LOW (ref >39)
LDL Calculated: 134 mg/dL — ABNORMAL HIGH (ref 0–99)
Triglycerides: 180 mg/dL — ABNORMAL HIGH (ref 0–149)
VLDL CHOLESTEROL CAL: 36 mg/dL (ref 5–40)

## 2018-03-16 LAB — TSH: TSH: 1.46 u[IU]/mL (ref 0.450–4.500)

## 2018-03-28 DIAGNOSIS — Z3009 Encounter for other general counseling and advice on contraception: Secondary | ICD-10-CM | POA: Diagnosis not present

## 2018-08-01 ENCOUNTER — Other Ambulatory Visit: Payer: Self-pay | Admitting: Family Medicine

## 2018-08-01 ENCOUNTER — Telehealth: Payer: Self-pay | Admitting: *Deleted

## 2018-08-01 MED ORDER — OSELTAMIVIR PHOSPHATE 75 MG PO CAPS: 75.0000 mg | ORAL_CAPSULE | Freq: Every day | ORAL | 0 refills | Status: AC

## 2018-08-01 NOTE — Telephone Encounter (Signed)
Three of my children were just diagnosed with Flu B today at urgent care. Could you please send in a tamiflu prescription for my husband just in case? Laurian BrimChristopher Nippert and his birthdate is 12/13/1985 and we now use [facility] in [place]. My youngest son has somehow not caught it yet so if we could send him a prescription too, that would be great. Sharol GivenSyon Abdou [birth year]

## 2018-08-01 NOTE — Telephone Encounter (Signed)
Left detailed message stating rx sent to pharmacy and to call back with any further questions or concerns.

## 2018-08-01 NOTE — Telephone Encounter (Signed)
I see Dr Oswaldo Donevincent took care of the child.  I have sent in for the father.

## 2018-08-24 ENCOUNTER — Ambulatory Visit (INDEPENDENT_AMBULATORY_CARE_PROVIDER_SITE_OTHER): Payer: BLUE CROSS/BLUE SHIELD | Admitting: Family Medicine

## 2018-08-24 ENCOUNTER — Encounter: Payer: Self-pay | Admitting: Family Medicine

## 2018-08-24 VITALS — BP 129/79 | HR 81 | Temp 97.5°F | Ht 67.0 in | Wt 258.0 lb

## 2018-08-24 DIAGNOSIS — F339 Major depressive disorder, recurrent, unspecified: Secondary | ICD-10-CM | POA: Diagnosis not present

## 2018-08-24 DIAGNOSIS — F419 Anxiety disorder, unspecified: Secondary | ICD-10-CM | POA: Diagnosis not present

## 2018-08-24 MED ORDER — CITALOPRAM HYDROBROMIDE 10 MG PO TABS: 10.0000 mg | ORAL_TABLET | Freq: Every day | ORAL | 1 refills | Status: DC

## 2018-08-24 NOTE — Patient Instructions (Signed)
See me in 6 weeks for recheck.  Taking the medicine as directed and not missing any doses is one of the best things you can do to treat your depression/anxiety.  Here are some things to keep in mind:  1) Side effects (stomach upset, some increased anxiety) may happen before you notice a benefit.  These side effects typically go away over time. 2) Changes to your dose of medicine or a change in medication all together is sometimes necessary 3) Most people need to be on medication at least 12 months 4) Many people will notice an improvement within two weeks but the full effect of the medication can take up to 4-6 weeks 5) Stopping the medication when you start feeling better often results in a return of symptoms 6) Never discontinue your medication without contacting a health care professional first.  Some medications require gradual discontinuation/ taper and can make you sick if you stop them abruptly.  If your symptoms worsen or you have thoughts of suicide/homicide, PLEASE SEEK IMMEDIATE MEDICAL ATTENTION.  You may always call:  National Suicide Hotline: (636)773-2805 [place] Crisis Line: (517)029-3083 Crisis Recovery in [place]: 762-178-2527   These are available 24 hours a day, 7 days a week.  Your provider wants you to schedule an appointment with a Psychologist/Psychiatrist. The following list of offices requires the patient to call and make their own appointment, as there is information they need that only you can provide. Please feel free to choose form the following providers:  [facility]   [phone] Crisis Recovery in [place] [phone]  [facility]  [phone] [place], [place]  (Scheduled through [facility]) Must call and do an interview for appointment. Sees Children / Accepts Medicaid  [facility]    [phone]  [address]    [place], [place]       [facility]  [phone] [address] [place], [place]  Evaluates for Autism but does not treat it Sees Children / Accepts Medicaid  [facility]    [phone] [address]   [place], [place] Medication management, substance abuse, bipolar, grief, family, marriage, OCD, anxiety, PTSD Sees children / Accepts Medicaid  [facility]    [phone] [address] [place], [place] Sees children / Accepts [facility]  [facility]  [phone] [address] [place], [place]   Dr Estelle Grumbles     [phone] [address] [place], [place]  Sees ADD & ADHD for treatment Accepts Medicaid  [facility]  [phone] [place], [place] Evaluates for Autism Accepts [facility]  [facility]  [phone] [address] [place], [place]  Does Adult ADD evaluations Does not accept Medicaid  [facility]   [phone] [address] [place], [place] Uses animal therapy  Sees children as young as 55 years old Accepts [facility]     [phone]    [address]  [place], [place] [address] Sees children Accepts Medicaid

## 2018-08-24 NOTE — Progress Notes (Signed)
Subjective: CC: Depression/ mood PCP: Raliegh Ip, DO GQQ:[id number] A Torain is a 33 y.o. male presenting to clinic today for:  1. Depression/ anxiety Patient here with his wife today for depressive symptoms.  He reports mood swings, anger outbursts and anxiety.  He had similar symptoms after the sudden death of his father several years ago.  He was placed on Wellbutrin but did not feel that the symptoms were especially relieved by the medication.  He self discontinued after about 6 months of treatment.  As of late, similar symptoms have been recurring.  He does not have any suicidal plans but does sometimes wonder how life would be if he was not here.  He feels guilt and frustration over any emotional damage he may be causing his family, specifically his wife.  He does not want to be on medication forever and voices concern over developing bipolar disorder, as this is present in his mother who is estranged from/has a strained relationship with.  He cites limited sleep as part of the problem.  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  He denies any manic symptoms. No substance use.   ROS: Per HPI  No Known Allergies Past Medical History:  Diagnosis Date  . Depression   . Obesity    No current outpatient medications on file. Social History   Socioeconomic History  . Marital status: Married    Spouse name: Not on file  . Number of children: 4  . Years of education: Not on file  . Highest education level: Not on file  Occupational History    Employer: [employer]    Comment: works on [employer] shift  Social Needs  . Financial resource strain: Not on file  . Food insecurity:    Worry: Not on file    Inability: Not on file  . Transportation needs:    Medical: Not on file   Non-medical: Not on file  Tobacco Use  . Smoking status: Never Smoker  . Smokeless tobacco: Never Used  Substance and Sexual Activity  . Alcohol use: No  . Drug use: Not on file  . Sexual activity: Yes  Lifestyle  . Physical activity:    Days per week: Not on file    Minutes per session: Not on file  . Stress: Not on file  Relationships  . Social connections:    Talks on phone: Not on file    Gets together: Not on file    Attends religious service: Not on file    Active member of club or organization: Not on file    Attends meetings of clubs or organizations: Not on file    Relationship status: Not on file  . Intimate partner violence:    Fear of current or ex partner: Not on file    Emotionally abused: Not on file    Physically abused: Not on file    Forced sexual activity: Not on file  Other Topics Concern  . Not on file  Social History Narrative  . Not on file   Family History  Problem Relation Age of Onset  . Hyperlipidemia Father   . Early death Father   . Heart disease Father   . Skin cancer Father   . Bipolar disorder Mother   . Early death Paternal Grandfather   .  Heart disease Paternal Grandfather     Objective: Office vital signs reviewed. BP 129/79   Pulse 81   Temp (!) 97.5 F (36.4 C) (Oral)   Ht 5\' 7"  (1.702 m)   Wt 258 lb (117 kg)   BMI 40.41 kg/m   Physical Examination:  General: Awake, alert, obese, No acute distress Psych: Mood depressed, intermittently tearful.  Eye contact fair.  Does not appear to be responding to internal stimuli.  Pleasant. Depression screen [facility] 2/9 08/24/2018 03/15/2018 10/20/2017  Decreased Interest 1 0 0  Down, Depressed, Hopeless 2 0 1  PHQ - 2 Score 3 0 1  Altered sleeping 0 - -  Tired, decreased energy 1 - -  Change in appetite 1 - -  Feeling bad or failure about yourself  1 - -  Trouble concentrating 1 - -  Moving slowly or fidgety/restless 0 - -  Suicidal thoughts 1 - -  PHQ-9 Score 8 - -   GAD 7 :  Generalized Anxiety Score 08/24/2018 03/15/2018  Nervous, Anxious, on Edge 2 1  Control/stop worrying 2 0  Worry too much - different things 2 3  Trouble relaxing 1 1  Restless 0 0  Easily annoyed or irritable 3 1  Afraid - awful might happen 1 0  Total GAD 7 Score 11 6  Anxiety Difficulty Somewhat difficult -    Mood questionnaire: Negative.  Assessment/ Plan: 33 y.o. male   1. Depression, recurrent (HCC) Patient with depressive and anxiety symptoms.  Mood questionnaire negative suggesting against bipolar disorder or other mood disorder.  Though he does have a family history of bipolar disorder in both his mother and grandfather.  He was prescribed Celexa 10 mg daily during today's office visit.  I have given him a handout on local resources for counseling and psychiatric services should he decide to pursue them though he was not amenable this today.  I have given him a copy of a national suicide hotline number, [place] crisis hotline number and [facility] crisis hotline number.  I have encouraged him to contact me with any questions or concerns.  He will follow-up with me in the next 6 to 8 weeks for recheck, sooner if needed.  2. Anxiety As above.   No orders of the defined types were placed in this encounter.  Meds ordered this encounter  Medications  . citalopram (CELEXA) 10 MG tablet    Sig: Take 1 tablet (10 mg total) by mouth daily.    Dispense:  30 tablet    Refill:  1   Total time spent with patient 25 minutes.  Greater than 50% of encounter spent in coordination of care/counseling.  Raliegh IpAshly M , DO [facility] [phone]

## 2018-10-05 ENCOUNTER — Encounter: Payer: Self-pay | Admitting: Family Medicine

## 2018-10-05 ENCOUNTER — Ambulatory Visit: Payer: BLUE CROSS/BLUE SHIELD | Admitting: Family Medicine

## 2018-10-05 VITALS — BP 128/81 | HR 73 | Temp 97.8°F | Ht 67.0 in | Wt 253.0 lb

## 2018-10-05 DIAGNOSIS — F419 Anxiety disorder, unspecified: Secondary | ICD-10-CM

## 2018-10-05 DIAGNOSIS — F339 Major depressive disorder, recurrent, unspecified: Secondary | ICD-10-CM

## 2018-10-05 MED ORDER — CITALOPRAM HYDROBROMIDE 20 MG PO TABS: 20.0000 mg | ORAL_TABLET | Freq: Every day | ORAL | 0 refills | Status: DC

## 2018-10-05 NOTE — Progress Notes (Signed)
Subjective: CC: Depression/ mood PCP: Raliegh Ip, DO [id number] Todd Sandoval is Todd 33 y.o. male presenting to clinic today for:  1. Depression/ anxiety History: Had Todd history of depression anxiety previously.  Previously treated with Wellbutrin but was unresponsive to medicine.  No history of hospitalizations for mental health.  No history of SI or HI.  Symptoms onset after the passing of his father several years ago.  Family history significant for bipolar disorder in his mother.  Patient here for 6-week follow-up on anxiety and depression.  He was started on Celexa 10 mg daily at last visit.  He notes that he certainly has had an improvement in symptoms, citing that his symptoms seem much more manageable.  He is less reactive, though still has anxiety symptoms and episodes of anger.  He does not have "blackout spells" when he becomes significantly angered.  He feels that he is able to better cope with his symptoms.  Mood has been Todd lot less labile.  He has had no libido changes.  No GI symptoms related to the medication.  No sedation from the medicine.  He is content with how things are going but does wish to increase the dose.   ROS: Per HPI  No Known Allergies Past Medical History:  Diagnosis Date  . Depression   . Obesity     Current Outpatient Medications:  .  citalopram (CELEXA) 10 MG tablet, Take 1 tablet (10 mg total) by mouth daily., Disp: 30 tablet, Rfl: 1 Social History   Socioeconomic History  . Marital status: Married    Spouse name: Not on file  . Number of children: 4  . Years of education: Not on file  . Highest education level: Not on file  Occupational History    Employer: [employer]    Comment: works on [employer] shift  Social Needs  . Financial resource strain: Not on file  . Food insecurity:    Worry: Not on file    Inability: Not on file  . Transportation needs:    Medical: Not on file    Non-medical: Not on file  Tobacco Use  .  Smoking status: Never Smoker  . Smokeless tobacco: Never Used  Substance and Sexual Activity  . Alcohol use: No  . Drug use: Not on file  . Sexual activity: Yes  Lifestyle  . Physical activity:    Days per week: Not on file    Minutes per session: Not on file  . Stress: Not on file  Relationships  . Social connections:    Talks on phone: Not on file    Gets together: Not on file    Attends religious service: Not on file    Active member of club or organization: Not on file    Attends meetings of clubs or organizations: Not on file    Relationship status: Not on file  . Intimate partner violence:    Fear of current or ex partner: Not on file    Emotionally abused: Not on file    Physically abused: Not on file    Forced sexual activity: Not on file  Other Topics Concern  . Not on file  Social History Narrative  . Not on file   Family History  Problem Relation Age of Onset  . Hyperlipidemia Father   . Early death Father   . Heart disease Father   . Skin cancer Father   . Bipolar disorder Mother   . Early death  Paternal Grandfather   . Heart disease Paternal Grandfather     Objective: Office vital signs reviewed. BP 128/81   Pulse 73   Temp 97.8 F (36.6 C) (Oral)   Ht 5\' 7"  (1.702 m)   Wt 253 lb (114.8 kg)   BMI 39.63 kg/m   Physical Examination:  General: Awake, alert, obese, No acute distress Cardio: RRR, S1-S2 heard.  No murmurs. Pulmonary: Clear to auscultation bilaterally.  Normal work of breathing on room air.  No wheezes, rhonchi or rales Psych: Mood depressed, intermittently tearful.  Eye contact fair.  Does not appear to be responding to internal stimuli.  Pleasant. Depression screen [facility] 2/9 10/05/2018 08/24/2018 03/15/2018  Decreased Interest 1 1 0  Down, Depressed, Hopeless 1 2 0  PHQ - 2 Score 2 3 0  Altered sleeping 0 0 -  Tired, decreased energy 0 1 -  Change in appetite 0 1 -  Feeling bad or failure about yourself  - 1 -  Trouble concentrating 0  1 -  Moving slowly or fidgety/restless 0 0 -  Suicidal thoughts 0 1 -  PHQ-9 Score 2 8 -   GAD 7 : Generalized Anxiety Score 10/05/2018 08/24/2018 03/15/2018  Nervous, Anxious, on Edge 1 2 1   Control/stop worrying 1 2 0  Worry too much - different things 0 2 3  Trouble relaxing 0 1 1  Restless 0 0 0  Easily annoyed or irritable 1 3 1   Afraid - awful might happen 1 1 0  Total GAD 7 Score 4 11 6   Anxiety Difficulty - Somewhat difficult -   Assessment/ Plan: 33 y.o. male   1. Depression, recurrent (HCC) Both PHQ 9 and gad 7 score are improving.  Subjectively he is doing better.  I do think that there is room for advancement of the medication and better control of symptoms as he continues to have anger and anxiety, albeit better controlled.  I have increased his dose to Celexa 20 mg daily.  He may double up on his 10 mg until he finishes current bottle.  Plan to see him back in about 6 to 8 weeks, sooner if needed.  2. Anxiety As above.   No orders of the defined types were placed in this encounter.  Meds ordered this encounter  Medications  . citalopram (CELEXA) 20 MG tablet    Sig: Take 1 tablet (20 mg total) by mouth daily.    Dispense:  90 tablet    Refill:  0   W[place]ona [place] Hulen Skains, DO [facility] [phone]

## 2018-10-05 NOTE — Patient Instructions (Signed)
I am so pleased with how you are doing.  I think we can get better control of symptoms by increasing dose to 20mg  though.  I have sent this new script in.  You may take 2 tablets of the (10mg ) med that you have at home until you finish your supply.  We talked about perhaps switching your dose to morning times that you are not awake during the 2-hour window where your medicine seems to wear off.  I think this would probably work out fine for you since you are not sleepy on the medicine anyways.  Plan to see me back in about a 1.5- 2 months for recheck, sooner if needed.

## 2018-11-25 ENCOUNTER — Ambulatory Visit (INDEPENDENT_AMBULATORY_CARE_PROVIDER_SITE_OTHER): Payer: BLUE CROSS/BLUE SHIELD | Admitting: Family Medicine

## 2018-11-25 ENCOUNTER — Other Ambulatory Visit: Payer: Self-pay

## 2018-11-25 DIAGNOSIS — F419 Anxiety disorder, unspecified: Secondary | ICD-10-CM | POA: Diagnosis not present

## 2018-11-25 DIAGNOSIS — F339 Major depressive disorder, recurrent, unspecified: Secondary | ICD-10-CM | POA: Diagnosis not present

## 2018-11-25 MED ORDER — CITALOPRAM HYDROBROMIDE 20 MG PO TABS: 20.0000 mg | ORAL_TABLET | Freq: Every day | ORAL | 2 refills | Status: DC

## 2018-11-25 NOTE — Progress Notes (Signed)
Telephone visit  Subjective: CC: Depression/ anxiety PCP: Raliegh Ip, DO AXK:[id number] A Todd Sandoval is a 33 y.o. male calls for telephone consult today. Patient provides verbal consent for consult held via phone.  Location of patient: home Location of provider: [facility] Others present for call: wife  1. Depression/ anxiety Patient was seen 6 weeks ago for depression and anxiety.  While his mood was less labile, he was still having some depressive/anxiety symptoms and his Celexa was increased to 20 mg daily.  He follows up today and notes that he is doing quite a bit better than our last visit.  He states that he has a couple of days where he has had some stressful days but otherwise nothing that is not manageable.  Sleep is good.  Only thing that is essentially been stressing him out is the COVID-19 outbreak and the implications it has on his job.  They had a positive COVID-19 family member of a coworker but the coworker remained in the plant because they did not have a resulted COVID-19 test.  Apparently he ended up having a positive COVID-19 test, despite being asymptomatic.  The patient has remained out of work since that event and may consider pursuing unemployment if he does not feel safe returning.  Otherwise, home life is good.  He seems very pleased with how things are going with the medication and wishes to continue current dose.   ROS: Per HPI  No Known Allergies Past Medical History:  Diagnosis Date  . Depression   . Obesity     Current Outpatient Medications:  .  citalopram (CELEXA) 20 MG tablet, Take 1 tablet (20 mg total) by mouth daily., Disp: 90 tablet, Rfl: 0  Depression screen [facility] 2/9 11/25/2018 10/05/2018 08/24/2018  Decreased Interest 0 1 1  Down, Depressed, Hopeless 0 1 2  PHQ - 2 Score 0 2 3  Altered sleeping 0 0 0  Tired, decreased energy 1 0 1  Change in appetite 0 0 1  Feeling bad or failure about yourself  0 - 1  Trouble concentrating 0 0 1  Moving slowly  or fidgety/restless 0 0 0  Suicidal thoughts 0 0 1  PHQ-9 Score 1 2 8    GAD 7 : Generalized Anxiety Score 11/25/2018 10/05/2018 08/24/2018 03/15/2018  Nervous, Anxious, on Edge 1 1 2 1   Control/stop worrying 0 1 2 0  Worry too much - different things 0 0 2 3  Trouble relaxing 0 0 1 1  Restless 0 0 0 0  Easily annoyed or irritable 0 1 3 1   Afraid - awful might happen 0 1 1 0  Total GAD 7 Score 1 4 11 6   Anxiety Difficulty Not difficult at all - Somewhat difficult -    Assessment/ Plan: 33 y.o. male   1. Depression, recurrent (HCC) He is doing extremely well on the current dose of Celexa.  I have extended his prescription out.  He may contact me if needed going forward.  Otherwise, we will plan to follow-up with regards to this issue after the new year. - citalopram (CELEXA) 20 MG tablet; Take 1 tablet (20 mg total) by mouth daily.  Dispense: 90 tablet; Refill: 2  2. Anxiety As above - citalopram (CELEXA) 20 MG tablet; Take 1 tablet (20 mg total) by mouth daily.  Dispense: 90 tablet; Refill: 2   Start time: 338p End time: 347pm  Total time spent on patient care (including telephone call/ virtual visit): 10 minutes  Glenroy Crossen M  Lajuana Ripple, [place] [phone]

## 2019-12-12 ENCOUNTER — Encounter: Payer: Self-pay | Admitting: Nurse Practitioner

## 2019-12-12 ENCOUNTER — Ambulatory Visit (INDEPENDENT_AMBULATORY_CARE_PROVIDER_SITE_OTHER): Payer: 59 | Admitting: Nurse Practitioner

## 2019-12-12 ENCOUNTER — Ambulatory Visit (INDEPENDENT_AMBULATORY_CARE_PROVIDER_SITE_OTHER): Payer: 59

## 2019-12-12 ENCOUNTER — Other Ambulatory Visit: Payer: Self-pay

## 2019-12-12 VITALS — BP 133/91 | HR 103 | Temp 98.2°F | Ht 67.0 in | Wt 314.8 lb

## 2019-12-12 DIAGNOSIS — M25571 Pain in right ankle and joints of right foot: Secondary | ICD-10-CM

## 2019-12-12 MED ORDER — IBUPROFEN 600 MG PO TABS: 600.0000 mg | ORAL_TABLET | Freq: Three times a day (TID) | ORAL | 0 refills | Status: DC | PRN

## 2019-12-12 NOTE — Patient Instructions (Addendum)
Right ankle pain is not well managed, pain is moderate and swelling is decreased from day of injury. Provided education to rest joint, apply ice, compression and elevate feet. Patient  knows to call with worsening or unresolved symptoms. Ibuprofen as needed (education provided to patient on interaction between NSAID and Celexa, patient verbalize understanding. Patient states he uses ibuprofen at home and has had no side effects.) Printed hand out provided. X-ray completed. Rx sent to pharmacy   Ankle Pain The ankle joint holds your body weight and allows you to move around. Ankle pain can occur on either side or the back of one ankle or both ankles. Ankle pain may be sharp and burning or dull and aching. There may be tenderness, stiffness, redness, or warmth around the ankle. Many things can cause ankle pain, including an injury to the area and overuse of the ankle. Follow these instructions at home: Activity  Rest your ankle as told by your health care provider. Avoid any activities that cause ankle pain.  Do not use the injured limb to support your body weight until your health care provider says that you can. Use crutches as told by your health care provider.  Do exercises as told by your health care provider.  Ask your health care provider when it is safe to drive if you have a brace on your ankle. If you have a brace:  Wear the brace as told by your health care provider. Remove it only as told by your health care provider.  Loosen the brace if your toes tingle, become numb, or turn cold and blue.  Keep the brace clean.  If the brace is not waterproof: ? Do not let it get wet. ? Cover it with a watertight covering when you take a bath or shower. If you were given an elastic bandage:   Remove it when you take a bath or a shower.  Try not to move your ankle very much, but wiggle your toes from time to time. This helps to prevent swelling.  Adjust the bandage to make it more  comfortable if it feels too tight.  Loosen the bandage if you have numbness or tingling in your foot or if your foot turns cold and blue. Managing pain, stiffness, and swelling   If directed, put ice on the painful area. ? If you have a removable brace or elastic bandage, remove it as told by your health care provider. ? Put ice in a plastic bag. ? Place a towel between your skin and the bag. ? Leave the ice on for 20 minutes, 2-3 times a day.  Move your toes often to avoid stiffness and to lessen swelling.  Raise (elevate) your ankle above the level of your heart while you are sitting or lying down. General instructions  Record information about your pain. Writing down the following may be helpful for you and your health care provider: ? How often you have ankle pain. ? Where the pain is located. ? What the pain feels like.  If treatment involves wearing a prescribed shoe or insole, make sure you wear it correctly and for as long as told by your health care provider.  Take over-the-counter and prescription medicines only as told by your health care provider.  Keep all follow-up visits as told by your health care provider. This is important. Contact a health care provider if:  Your pain gets worse.  Your pain is not relieved with medicines.  You have a fever or  chills.  You are having more trouble with walking.  You have new symptoms. Get help right away if:  Your foot, leg, toes, or ankle: ? Tingles or becomes numb. ? Becomes swollen. ? Turns pale or blue. Summary  Ankle pain can occur on either side or the back of one ankle or both ankles.  Ankle pain may be sharp and burning or dull and aching.  Rest your ankle as told by your health care provider. If told, apply ice to the area.  Take over-the-counter and prescription medicines only as told by your health care provider. This information is not intended to replace advice given to you by your health care  provider. Make sure you discuss any questions you have with your health care provider. Document Revised: 11/08/2018 Document Reviewed: 01/26/2018 Elsevier Patient Education  [employer].

## 2019-12-12 NOTE — Progress Notes (Signed)
Acute Office Visit  Subjective:    Patient ID: Todd Sandoval, male    DOB: August 14, 1985, 34 y.o.   MRN: [id number]  Chief Complaint  Patient presents with  . Ankle Injury   Patient is here today for; Ankle Pain  The incident occurred 5 to 7 days ago. The incident occurred at home. The injury mechanism was a twisting injury. The pain is present in the right ankle. The pain is at a severity of 4/10. The pain is moderate. The pain has been constant since onset. Associated symptoms include muscle weakness. He reports no foreign bodies present. The symptoms are aggravated by movement. He has tried NSAIDs and ice for the symptoms. The treatment provided moderate relief.    Past Medical History:  Diagnosis Date  . Depression   . Obesity     Past Surgical History:  Procedure Laterality Date  . none    . VASECTOMY      Family History  Problem Relation Age of Onset  . Hyperlipidemia Father   . Early death Father   . Heart disease Father   . Skin cancer Father   . Bipolar disorder Mother   . Early death Paternal Grandfather   . Heart disease Paternal Grandfather     Social History   Socioeconomic History  . Marital status: Married    Spouse name: Not on file  . Number of children: 4  . Years of education: Not on file  . Highest education level: Not on file  Occupational History    Employer: [employer]    Comment: works on [employer] shift  Tobacco Use  . Smoking status: Never Smoker  . Smokeless tobacco: Never Used  Substance and Sexual Activity  . Alcohol use: No  . Drug use: Not on file  . Sexual activity: Yes  Other Topics Concern  . Not on file  Social History Narrative  . Not on file   Social Determinants of Health   Financial Resource Strain:   . Difficulty of Paying Living Expenses:   Food Insecurity:   . Worried About Programme researcher, broadcasting/film/video in the Last Year:   . Barista in the Last Year:   Transportation Needs:   . Automotive engineer (Medical):   Marland Kitchen Lack of Transportation (Non-Medical):   Physical Activity:   . Days of Exercise per Week:   . Minutes of Exercise per Session:   Stress:   . Feeling of Stress :   Social Connections:   . Frequency of Communication with Friends and Family:   . Frequency of Social Gatherings with Friends and Family:   . Attends Religious Services:   . Active Member of Clubs or Organizations:   . Attends Banker Meetings:   Marland Kitchen Marital Status:   Intimate Partner Violence:   . Fear of Current or Ex-Partner:   . Emotionally Abused:   Marland Kitchen Physically Abused:   . Sexually Abused:     Outpatient Medications Prior to Visit  Medication Sig Dispense Refill  . citalopram (CELEXA) 20 MG tablet Take 1 tablet (20 mg total) by mouth daily. 90 tablet 2   No facility-administered medications prior to visit.    No Known Allergies  Review of Systems  Constitutional: Positive for activity change.  Respiratory: Negative for chest tightness and shortness of breath.   Cardiovascular: Positive for leg swelling. Negative for chest pain and palpitations.  Genitourinary: Negative for difficulty urinating.  Musculoskeletal: Positive for  gait problem, joint swelling and myalgias.  Skin: Negative for color change.  Neurological: Negative for headaches.  Psychiatric/Behavioral: The patient is not nervous/anxious.        Objective:    Physical Exam  BP (!) 133/91   Pulse (!) 103   Temp 98.2 F (36.8 C)   Ht 5\' 7"  (1.702 m)   Wt (!) 314 lb 12.8 oz (142.8 kg)   SpO2 97%   BMI 49.30 kg/m  Wt Readings from Last 3 Encounters:  12/12/19 (!) 314 lb 12.8 oz (142.8 kg)  10/05/18 253 lb (114.8 kg)  08/24/18 258 lb (117 kg)    Health Maintenance Due  Topic Date Due  . HIV Screening  Never done  . COVID-19 Vaccine (1) Never done  . TETANUS/TDAP  09/11/2015    Lab Results  Component Value Date   TSH 1.460 03/15/2018   Lab Results  Component Value Date   WBC 5.5  03/15/2018   HGB 15.4 03/15/2018   HCT 45.0 03/15/2018   MCV 85 03/15/2018   PLT 249 03/15/2018   Lab Results  Component Value Date   NA 141 03/15/2018   K 4.3 03/15/2018   CO2 23 03/15/2018   GLUCOSE 91 03/15/2018   BUN 15 03/15/2018   CREATININE 0.85 03/15/2018   BILITOT 1.0 03/15/2018   ALKPHOS 65 03/15/2018   AST 25 03/15/2018   ALT 55 (H) 03/15/2018   PROT 7.2 03/15/2018   ALBUMIN 4.2 03/15/2018   CALCIUM 9.6 03/15/2018   Lab Results  Component Value Date   CHOL 198 03/15/2018   Lab Results  Component Value Date   HDL 28 (L) 03/15/2018   Lab Results  Component Value Date   LDLCALC 134 (H) 03/15/2018   Lab Results  Component Value Date   TRIG 180 (H) 03/15/2018   Lab Results  Component Value Date   CHOLHDL 7.1 (H) 03/15/2018   Lab Results  Component Value Date   HGBA1C 5.3 03/15/2018       Assessment & Plan:   Problem List Items Addressed This Visit      Other   Acute right ankle pain - Primary    Right ankle pain is not well managed, pain is moderate and swelling is decreased from day of injury. Provided education to rest joint, apply ice, compression and elevate feet. Patient  knows to call with worsening or unresolved symptoms. Ibuprofen as needed (education provided to patient on interaction between NSAID and Celexa, patient verbalize understanding. Patient states he uses ibuprofen at home and has had no side effects.) Printed hand out provided. X-ray completed. Rx sent to pharmacy      Relevant Medications   ibuprofen (ADVIL) 600 MG tablet   Other Relevant Orders   DG Ankle Complete Right       Meds ordered this encounter  Medications  . ibuprofen (ADVIL) 600 MG tablet    Sig: Take 1 tablet (600 mg total) by mouth every 8 (eight) hours as needed.    Dispense:  30 tablet    Refill:  0    Order Specific Question:   Supervising Provider    Answer:   Caryl Pina A [[id number]]     Ivy Lynn, NP

## 2019-12-12 NOTE — Assessment & Plan Note (Signed)
Right ankle pain is not well managed, pain is moderate and swelling is decreased from day of injury. Provided education to rest joint, apply ice, compression and elevate feet. Patient  knows to call with worsening or unresolved symptoms. Ibuprofen as needed (education provided to patient on interaction between NSAID and Celexa, patient verbalize understanding. Patient states he uses ibuprofen at home and has had no side effects.) Printed hand out provided. X-ray completed. Rx sent to pharmacy

## 2019-12-13 ENCOUNTER — Telehealth: Payer: Self-pay | Admitting: Family Medicine

## 2019-12-13 NOTE — Telephone Encounter (Signed)
Aware of Xray results

## 2019-12-15 ENCOUNTER — Other Ambulatory Visit: Payer: Self-pay | Admitting: Nurse Practitioner

## 2019-12-15 MED ORDER — PREDNISONE 10 MG (21) PO TBPK: ORAL_TABLET | ORAL | 0 refills | Status: DC

## 2019-12-22 ENCOUNTER — Ambulatory Visit (INDEPENDENT_AMBULATORY_CARE_PROVIDER_SITE_OTHER): Payer: 59 | Admitting: Family Medicine

## 2019-12-22 ENCOUNTER — Other Ambulatory Visit: Payer: Self-pay

## 2019-12-22 ENCOUNTER — Encounter: Payer: Self-pay | Admitting: Family Medicine

## 2019-12-22 VITALS — BP 128/81 | HR 90 | Temp 99.0°F | Ht 67.0 in | Wt 318.0 lb

## 2019-12-22 DIAGNOSIS — G4730 Sleep apnea, unspecified: Secondary | ICD-10-CM | POA: Diagnosis not present

## 2019-12-22 DIAGNOSIS — F339 Major depressive disorder, recurrent, unspecified: Secondary | ICD-10-CM | POA: Diagnosis not present

## 2019-12-22 DIAGNOSIS — Z0001 Encounter for general adult medical examination with abnormal findings: Secondary | ICD-10-CM

## 2019-12-22 DIAGNOSIS — R7303 Prediabetes: Secondary | ICD-10-CM

## 2019-12-22 DIAGNOSIS — R2231 Localized swelling, mass and lump, right upper limb: Secondary | ICD-10-CM

## 2019-12-22 DIAGNOSIS — Z0000 Encounter for general adult medical examination without abnormal findings: Secondary | ICD-10-CM

## 2019-12-22 DIAGNOSIS — F419 Anxiety disorder, unspecified: Secondary | ICD-10-CM

## 2019-12-22 DIAGNOSIS — Z130 Encounter for screening for diseases of the blood and blood-forming organs and certain disorders involving the immune mechanism: Secondary | ICD-10-CM

## 2019-12-22 LAB — BAYER DCA HB A1C WAIVED: HB A1C (BAYER DCA - WAIVED): 6.2 % (ref <7.0)

## 2019-12-22 MED ORDER — CITALOPRAM HYDROBROMIDE 20 MG PO TABS: 20.0000 mg | ORAL_TABLET | Freq: Every day | ORAL | 3 refills | Status: DC

## 2019-12-22 NOTE — Patient Instructions (Signed)
We talked about:  Saxenda (injectable, slows gut motility)  Wellbutrin (would help with depression, focus and appetite)  Phentermine (stimulant, used for up to 3 months)  Vyvanse (stimulant, used for ADHD, binge eating)  Let me know which you'd like to start.

## 2019-12-22 NOTE — Progress Notes (Signed)
Todd Sandoval is a 34 y.o. male presents to office today for annual physical exam examination.    Concerns today include:  1.  Morbid obesity/ daytime fatigue Patient is very interested in pursuing weight loss.  He is currently at the max weight he is ever been at 318 pounds.  He is gradually gained more weight over this last year.  He is currently not employed.  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  He admits to binge eating.  He describes this as eating past the point of fullness and often feeling sick and guilty after overeating.  He describes a recent event where he was preparing crab cakes to go along with leftover mashed potatoes and while these things were getting reheated he ate 2 pieces of pizza.  He admits that this is not good for his health.  He eats vegetables may be only 1-2 times per week.  His meals are typically carb and meat heavy.  He does not drink any sugary beverages.  He discussed with his wife starting a walking regimen.  He has been on various crash diets with no significant sustainable weight loss.  His high school weight was around 175 pounds and his goal weight is around 190 pounds.  No history of seizure disorder.  No history of pancreatitis, personal history of thyroid cancer.  No family history of medullary thyroid cancers or MEN tumors.  He admits to snoring and apneic episodes.  His wife is appreciated apneic episodes over the last 6 years.  He would like to go ahead and get a sleep study done as well.  2.  Depressive disorder Patient reports fair control of depression.  He notes that since he has gained more weight and is more fatigued his depression is somewhat worse some days.  He continues to take his Celexa daily as prescribed.  3.  Hand nodule Patient reports a nodule along the right middle finger on the palmar side that has been present for quite some time now.  He notes that it gets larger  and smaller depending on what he is doing.  It does cause quite a bit of discomfort when pushed upon.  Denies any locking or popping of the finger.  Occupation: unemployed, Marital status: married, Substance use: none Diet: carb/ meat heavy, Exercise: no structured Refills needed today: celexa  Past Medical History:  Diagnosis Date  . Depression   . Obesity    Social History   Socioeconomic History  . Marital status: Married    Spouse name: Not on file  . Number of children: 4  . Years of education: Not on file  . Highest education level: Not on file  Occupational History    Employer: [employer]    Comment: works on [employer] shift  Tobacco Use  . Smoking status: Never Smoker  . Smokeless tobacco: Never Used  Substance and Sexual Activity  . Alcohol use: No  . Drug use: Not on file  . Sexual activity: Yes  Other Topics Concern  . Not on file  Social History Narrative  . Not on file   Social Determinants of Health   Financial Resource Strain:   . Difficulty of Paying Living Expenses:   Food Insecurity:   . Worried About Charity fundraiser in the Last Year:   . Arboriculturist in the Last Year:   Transportation Needs:   .  Lack of Transportation (Medical):   Marland Kitchen Lack of Transportation (Non-Medical):   Physical Activity:   . Days of Exercise per Week:   . Minutes of Exercise per Session:   Stress:   . Feeling of Stress :   Social Connections:   . Frequency of Communication with Friends and Family:   . Frequency of Social Gatherings with Friends and Family:   . Attends Religious Services:   . Active Member of Clubs or Organizations:   . Attends Archivist Meetings:   Marland Kitchen Marital Status:   Intimate Partner Violence:   . Fear of Current or Ex-Partner:   . Emotionally Abused:   Marland Kitchen Physically Abused:   . Sexually Abused:    Past Surgical History:  Procedure Laterality Date  . none    . VASECTOMY     Family History  Problem Relation Age of  Onset  . Hyperlipidemia Father   . Early death Father   . Heart disease Father   . Skin cancer Father   . Bipolar disorder Mother   . Early death Paternal Grandfather   . Heart disease Paternal Grandfather     Current Outpatient Medications:  .  citalopram (CELEXA) 20 MG tablet, Take 1 tablet (20 mg total) by mouth daily., Disp: 90 tablet, Rfl: 2 .  ibuprofen (ADVIL) 600 MG tablet, Take 1 tablet (600 mg total) by mouth every 8 (eight) hours as needed., Disp: 30 tablet, Rfl: 0  No Known Allergies   ROS: Review of Systems Pertinent items noted in HPI and remainder of comprehensive ROS otherwise negative.    Physical exam BP 128/81   Pulse 90   Temp 99 F (37.2 C)   Ht '5\' 7"'$  (1.702 m)   Wt (!) 318 lb (144.2 kg)   SpO2 97%   BMI 49.81 kg/m  General appearance: alert, cooperative, appears stated age and morbidly obese Head: Normocephalic, without obvious abnormality, atraumatic Eyes: negative findings: lids and lashes normal, conjunctivae and sclerae normal, corneas clear and pupils equal, round, reactive to light and accomodation Ears: TMs obscured by Cerumen bilaterally Nose: Nares normal. Septum midline. Mucosa normal. No drainage or sinus tenderness. Throat: lips, mucosa, and tongue normal; teeth and gums normal Neck: no adenopathy, no carotid bruit, supple, symmetrical, trachea midline and thyroid not enlarged, symmetric, no tenderness/mass/nodules Back: symmetric, no curvature. ROM normal. No CVA tenderness. Lungs: clear to auscultation bilaterally Chest wall: no tenderness Heart: regular rate and rhythm, S1, S2 normal, no murmur, click, rub or gallop Abdomen: soft, non-tender; bowel sounds normal; no masses,  no organomegaly and obese Extremities: extremities normal, atraumatic, no cyanosis or edema Pulses: 2+ and symmetric Skin: Skin color, texture, turgor normal. No rashes or lesions  Hand: right middle finger with palpable nodule along the palmar aspect. Lymph  nodes: Cervical, supraclavicular, and axillary nodes normal. Neurologic: Alert and oriented X 3, normal strength and tone. Normal symmetric reflexes. Normal coordination and gait Psych: Mood stable, speech normal, affect appropriate, pleasant and interactive Depression screen [facility] 2/9 12/22/2019 12/22/2019 12/12/2019  Decreased Interest 1 0 0  Down, Depressed, Hopeless 1 0 0  PHQ - 2 Score 2 0 0  Altered sleeping 3 - -  Tired, decreased energy 3 - -  Change in appetite 2 - -  Feeling bad or failure about yourself  1 - -  Trouble concentrating 0 - -  Moving slowly or fidgety/restless 0 - -  Suicidal thoughts 0 - -  PHQ-9 Score 11 - -  Difficult  doing work/chores Somewhat difficult - -    Assessment/ Plan: [name] here for annual physical exam.   1. Annual physical exam  2. Morbid obesity ([place]) Fasting labs.  Reinforced lifestyle modification.  We discussed a few different options for pharmacologic weight management including Vyvanse.  I do think he has a binge eating disorder.  We also discussed phentermine, Wellbutrin (which would help with his depressive disorder), and Saxenda.  A1c noted to be in the prediabetic range today. - CMP14+EGFR - Lipid Panel - TSH - Bayer DCA Hb A1c Waived  3. Depression, recurrent ([place]) Not controlled.  May need to consider addition of Wellbutrin 1 of the above - TSH - citalopram (CELEXA) 20 MG tablet; Take 1 tablet (20 mg total) by mouth daily.  Dispense: 90 tablet; Refill: 3  4. Anxiety - TSH - citalopram (CELEXA) 20 MG tablet; Take 1 tablet (20 mg total) by mouth daily.  Dispense: 90 tablet; Refill: 3  5. Screening, anemia, deficiency, iron - CBC  6. Sleep apnea, unspecified type STOP BANG score: 6.  Discussed sleep study. - Ambulatory referral to Neurology  7. Nodule of finger of right hand Referral to ortho. - Ambulatory referral to Orthopedic Surgery  8. Pre-diabetes A1c 6.2 today.  Counseled on healthy lifestyle  choices, including diet (rich in fruits, vegetables and lean meats and low in salt and simple carbohydrates) and exercise (at least 30 minutes of moderate physical activity daily).  Patient to follow up in 1 year for annual exam or sooner if needed.  Noelle Hoogland M. Lajuana Ripple, DO

## 2019-12-23 LAB — CMP14+EGFR
ALT: 57 IU/L — ABNORMAL HIGH (ref 0–44)
AST: 22 IU/L (ref 0–40)
Albumin/Globulin Ratio: 1.5 (ref 1.2–2.2)
Albumin: 4.1 g/dL (ref 4.0–5.0)
Alkaline Phosphatase: 82 IU/L (ref 48–121)
BUN/Creatinine Ratio: 12 (ref 9–20)
BUN: 9 mg/dL (ref 6–20)
Bilirubin Total: 0.9 mg/dL (ref 0.0–1.2)
CO2: 24 mmol/L (ref 20–29)
Calcium: 9.7 mg/dL (ref 8.7–10.2)
Chloride: 100 mmol/L (ref 96–106)
Creatinine, Ser: 0.76 mg/dL (ref 0.76–1.27)
GFR calc Af Amer: 140 mL/min/{1.73_m2} (ref >59)
GFR calc non Af Amer: 121 mL/min/{1.73_m2} (ref >59)
Globulin, Total: 2.8 g/dL (ref 1.5–4.5)
Glucose: 130 mg/dL — ABNORMAL HIGH (ref 65–99)
Potassium: 4.4 mmol/L (ref 3.5–5.2)
Sodium: 137 mmol/L (ref 134–144)
Total Protein: 6.9 g/dL (ref 6.0–8.5)

## 2019-12-23 LAB — CBC
Hematocrit: 44.5 % (ref 37.5–51.0)
Hemoglobin: 15.6 g/dL (ref 13.0–17.7)
MCH: 30.1 pg (ref 26.6–33.0)
MCHC: 35.1 g/dL (ref 31.5–35.7)
MCV: 86 fL (ref 79–97)
Platelets: 263 10*3/uL (ref 150–450)
RBC: 5.19 x10E6/uL (ref 4.14–5.80)
RDW: 12.6 % (ref 11.6–15.4)
WBC: 7.4 10*3/uL (ref 3.4–10.8)

## 2019-12-23 LAB — LIPID PANEL
Chol/HDL Ratio: 5.6 ratio — ABNORMAL HIGH (ref 0.0–5.0)
Cholesterol, Total: 195 mg/dL (ref 100–199)
HDL: 35 mg/dL — ABNORMAL LOW (ref >39)
LDL Chol Calc (NIH): 123 mg/dL — ABNORMAL HIGH (ref 0–99)
Triglycerides: 207 mg/dL — ABNORMAL HIGH (ref 0–149)
VLDL Cholesterol Cal: 37 mg/dL (ref 5–40)

## 2019-12-23 LAB — TSH: TSH: 0.859 u[IU]/mL (ref 0.450–4.500)

## 2019-12-25 ENCOUNTER — Telehealth: Payer: Self-pay | Admitting: Family Medicine

## 2019-12-25 NOTE — Telephone Encounter (Signed)
Please review

## 2019-12-26 ENCOUNTER — Other Ambulatory Visit: Payer: Self-pay | Admitting: Family Medicine

## 2019-12-26 DIAGNOSIS — F339 Major depressive disorder, recurrent, unspecified: Secondary | ICD-10-CM

## 2019-12-26 MED ORDER — BUPROPION HCL ER (XL) 150 MG PO TB24: 150.0000 mg | ORAL_TABLET | Freq: Every morning | ORAL | 0 refills | Status: DC

## 2020-01-09 ENCOUNTER — Encounter: Payer: Self-pay | Admitting: Neurology

## 2020-01-09 ENCOUNTER — Other Ambulatory Visit: Payer: Self-pay

## 2020-01-09 ENCOUNTER — Ambulatory Visit (INDEPENDENT_AMBULATORY_CARE_PROVIDER_SITE_OTHER): Payer: 59 | Admitting: Neurology

## 2020-01-09 VITALS — BP 151/95 | HR 90 | Ht 67.0 in | Wt 313.0 lb

## 2020-01-09 DIAGNOSIS — Z9189 Other specified personal risk factors, not elsewhere classified: Secondary | ICD-10-CM | POA: Diagnosis not present

## 2020-01-09 DIAGNOSIS — R0683 Snoring: Secondary | ICD-10-CM | POA: Insufficient documentation

## 2020-01-09 DIAGNOSIS — G4734 Idiopathic sleep related nonobstructive alveolar hypoventilation: Secondary | ICD-10-CM

## 2020-01-09 DIAGNOSIS — G478 Other sleep disorders: Secondary | ICD-10-CM | POA: Diagnosis not present

## 2020-01-09 DIAGNOSIS — G4733 Obstructive sleep apnea (adult) (pediatric): Secondary | ICD-10-CM

## 2020-01-09 NOTE — Progress Notes (Signed)
SLEEP MEDICINE CLINIC    Provider:  Melvyn Novas, MD  Primary Care Physician:  Raliegh Ip, DO [address] [place] [address]     Referring Provider: Jeronimo Greaves [address] [place],  [place] [address]          Chief Complaint according to patient   Patient presents with:    . New Patient (Initial Visit)     Was seen 5-25 by pcp, who started wellbutrin for fatigue.       HISTORY OF PRESENT ILLNESS:  Todd Sandoval is a 34 year- old  Caucasian male patient and  seen here upon a referral on 01/09/2020 , initiated by his wife, [name].  I have the pleasure of seeing Todd Sandoval today, a right -handed  Caucasian male with  possible OSA sleep disorder.  He   has a past medical history of Anxiety, Depression, and Super-Obesity.   Todd Sandoval confirms that it was his wife who initiated the referral for a sleep study today.  He has gradually gained weight over the last 5 years there have been several little things that may have contributed along the way but snoring has been present for longer than that it is just getting more and more intense.  His wife has not witnessed frequent apneas and it affects her sleep as she is worried about him.  His maximum weight was 318 pounds earlier this year.  He had sometimes been binge eating and for this reason was given Wellbutrin to help with his obsessive-compulsive behaviors and eating.  He has changed his dietary habits has incorporated fresh vegetables and fruits, he was advised of low-carb diet and protein.  He does not drink any sugary beverages.  The goal weight for him would be of 190 pounds.  There has been a history of depression but this seems to be well controlled.  He has reported choking or feeling that he could not breathe which wakes him up at night therefore his sleep has become more fragmented.  Dr. Nadine Counts also mentioned that his HbA1c was 6.2 which would declare him to be prediabetic-early  diabetes.  This at this time is still reversible.  He was also informed about phentermine as a stimulant Vyvanse as a stimulant Wellbutrin was chosen but Todd Sandoval was also discussed.  I have access to his recent lab results and except for his ALT being elevated 57 his triglycerides and his HDL cholesterol is low everything electrolytes or kidney function related looks good.  Thyroid function is normal.   The patient never had a sleep study.   Sleep relevant medical history:  None.     Family medical /sleep history: no other family member on CPAP with OSA, mother had insomnia, no sleep walkers.    Social history:  Patient is currently unemployed,  and lives in a household with 6 persons- he worked in Set designer , had physical jobs, often night shifts. Family status is married , with 4 children  Pets are present. 3 cats.  Tobacco use: never .  Wife smokes outside. ETOH use: none since 6 years ago.Caffeine intake in form of Coffee( sometimes) Soda( pepsi zero- 2/ day ) Tea ( none ) nor energy drinks.  Hobbies : golfing , woodworking,    Sleep habits are as follows: The patient's dinner time is between 6-7 PM. The patient goes to bed at 10 PM and continues to sleep for 2-3  hours, wakes by  choking, snoring, apnea., reflux. He has no trouble falling asleep, but staying asleep.  The preferred sleep position is supine with arms extended-, with the support of 2 pillows.  Dreams are reportedly rare.   7 AM is the usual rise time. The patient wakes up with an alarm at 7 AM.  He reports not feeling refreshed or restored in AM, with symptoms such as dry mouth ,  Frequent morning headaches and residual fatigue.  Naps are taken infrequently, lasting from 1-2  hours and are more refreshing than nocturnal sleep.    Review of Systems: Out of a complete 14 system review, the patient complains of only the following symptoms, and all other reviewed systems are negative.:  Fatigue, sleepiness , snoring,  fragmented sleep,   How likely are you to doze in the following situations: 0 = not likely, 1 = slight chance, 2 = moderate chance, 3 = high chance   Sitting and Reading? Watching Television? Sitting inactive in a public place (theater or meeting)? As a passenger in a car for an hour without a break? Lying down in the afternoon when circumstances permit? Sitting and talking to someone? Sitting quietly after lunch without alcohol? In a car, while stopped for a few minutes in traffic?   Total = 10/ 24 points   FSS endorsed at 46/ 63 points.   Social History   Socioeconomic History  . Marital status: Married    Spouse name: [name]  . Number of children: 4  . Years of education: 34  . Highest education level: Not on file  Occupational History  . Occupation: unemployed    Employer: Visual merchandiser    Comment: works on [employer] shift  Tobacco Use  . Smoking status: Never Smoker  . Smokeless tobacco: Never Used  Substance and Sexual Activity  . Alcohol use: No  . Drug use: Not on file  . Sexual activity: Yes  Other Topics Concern  . Not on file  Social History Narrative   Lives with wife and four children   Caffeine use: 30 oz per day   Right handed   Social Determinants of Health   Financial Resource Strain:   . Difficulty of Paying Living Expenses:   Food Insecurity:   . Worried About Programme researcher, broadcasting/film/video in the Last Year:   . Barista in the Last Year:   Transportation Needs:   . Freight forwarder (Medical):   Marland Kitchen Lack of Transportation (Non-Medical):   Physical Activity:   . Days of Exercise per Week:   . Minutes of Exercise per Session:   Stress:   . Feeling of Stress :   Social Connections:   . Frequency of Communication with Friends and Family:   . Frequency of Social Gatherings with Friends and Family:   . Attends Religious Services:   . Active Member of Clubs or Organizations:   . Attends Banker Meetings:   Marland Kitchen Marital  Status:     Family History  Problem Relation Age of Onset  . Hyperlipidemia Father   . Early death Father   . Heart disease Father   . Skin cancer Father   . Bipolar disorder Mother   . Early death Paternal Grandfather   . Heart disease Paternal Grandfather     Past Medical History:  Diagnosis Date  . Anxiety   . Depression   . Obesity     Past Surgical History:  Procedure Laterality Date  . VASECTOMY  Current Outpatient Medications on File Prior to Visit  Medication Sig Dispense Refill  . b complex vitamins tablet Take 1 tablet by mouth daily.    Marland Kitchen buPROPion (WELLBUTRIN XL) 150 MG 24 hr tablet Take 1 tablet (150 mg total) by mouth in the morning. 90 tablet 0  . citalopram (CELEXA) 20 MG tablet Take 1 tablet (20 mg total) by mouth daily. 90 tablet 3  . fexofenadine (ALLEGRA) 180 MG tablet Take 180 mg by mouth daily.     No current facility-administered medications on file prior to visit.    No Known Allergies  Physical exam:  Today's Vitals   01/09/20 1507  BP: (!) 151/95  Pulse: 90  SpO2: 98%  Weight: (!) 313 lb (142 kg)  Height: 5\' 7"  (1.702 m)   Body mass index is 49.02 kg/m.   Wt Readings from Last 3 Encounters:  01/09/20 (!) 313 lb (142 kg)  12/22/19 (!) 318 lb (144.2 kg)  12/12/19 (!) 314 lb 12.8 oz (142.8 kg)     Ht Readings from Last 3 Encounters:  01/09/20 5\' 7"  (1.702 m)  12/22/19 5\' 7"  (1.702 m)  12/12/19 5\' 7"  (1.702 m)      General: The patient is awake, alert and appears not in acute distress.  The patient is well dressed. Head: Normocephalic, atraumatic. Neck is supple. Mallampati 3   neck circumference: 18 inches . Nasal airflow barely patent.  Retrognathia is not  seen.  Dental status: intact  Cardiovascular:  Regular rate and cardiac rhythm by pulse,  without distended neck veins. Respiratory: Lungs are clear to auscultation.  Skin:  Without evidence of ankle edema, or rash. Trunk: The patient's posture is erect.     Neurologic exam : The patient is awake and alert, oriented to place and time.   Memory subjective described as intact.  Attention span & concentration ability appears normal.  Speech is fluent,  without  dysarthria, dysphonia or aphasia.  Mood and affect are appropriate.   Cranial nerves: no loss of smell or taste reported  Pupils are equal and briskly reactive to light. Funduscopic exam deferred.   Extraocular movements in vertical and horizontal planes were intact and without nystagmus. No Diplopia. Visual fields by finger perimetry are intact. Hearing was intact to soft voice and finger rubbing.    Facial sensation intact to fine touch.  Facial motor strength is symmetric and tongue and uvula move midline.  Neck ROM : rotation, tilt and flexion extension were normal for age and shoulder shrug was symmetrical.    Motor exam:  Symmetric bulk, tone and ROM.   Normal tone without cog- wheeling, symmetric grip strength .   Sensory:  Fine touch, pinprick and vibration were tested  and  normal.  Proprioception tested in the upper extremities was normal.   Coordination: Rapid alternating movements in the fingers/hands were of normal speed.  The Finger-to-nose maneuver was intact without evidence of ataxia, dysmetria or tremor.   Gait and station: Patient could rise unassisted from a seated position, walked without assistive device.  Stance is of normal width/ base.  Toe and heel walk were deferred.  Deep tendon reflexes: in the  upper and lower extremities are symmetric and intact.  Babinski response was deferred.        After spending a total time of  45 minutes face to face and additional time for physical and neurologic examination, review of laboratory studies,  personal review of imaging studies, reports and results of other  testing and review of referral information / records as far as provided in visit, I have established the following assessments:  1)  Morbid obesity BMI 49,  prediabetic, Hypertension, mouth breather. He has a very high risk for OSA but also for obesity hypoventilation. I will order a HST for the patient.  The patient is not yet vaccinated.     My Plan is to proceed with:  1) I will order a home sleep test for the patient, he has no prior sleep study, at this time he is not yet vaccinated for Covid and I will not be able to implement a split-night protocol.  I will make sure that we are reading his study as soon as possible once his home sleep test has been obtained.   I would like to thank Janora Norlander, DO and [place], [place],  [place] [address] for allowing me to meet with and to take care of this pleasant patient.   In short, Todd Sandoval is presenting with sleepiness, non restorative sleep and fatigue. EDS.  I plan to follow up  through our NP within 3 month.    Electronically signed by: Larey Seat, MD 01/09/2020 3:39 PM  [facility] Board certified by The [employer] of Sleep Medicine and Diplomate of the [employer] of Sleep Medicine. Board certified In Neurology through the [employer], Fellow of the [employer] of Neurology. Medical Director of [facility].

## 2020-01-09 NOTE — Patient Instructions (Signed)
Obesity Hypoventilation Syndrome  Obesity hypoventilation syndrome (OHS) means that you are not breathing well enough to get air in and out of your lungs efficiently (ventilation). This causes a low oxygen level and a high carbon dioxide level in your blood (hypoventilation). Having too much total body fat (obesity) is a significant risk factor for developing OHS. OHS makes it harder for your heart to pump oxygen-rich blood to your body. It can cause sleep disturbances and make you feel sleepy during the day. Over time, OHS can increase your risk for:  Heart disease.  High blood pressure (hypertension).  Reduced ability to absorb sugar from the bloodstream (insulin resistance).  Heart failure. Over time, OHS weakens your heart and can lead to heart failure. What are the causes? The exact cause of OHS is not known. Possible causes include:  Pressure on the lungs from excess body weight.  Obesity-related changes in how much air the lungs can hold (lung capacity) and how much they can expand (lung compliance).  Failure of the brain to regulate oxygen and carbon dioxide levels properly.  Chemicals (hormones) produced by excess fat cells interfering with breathing regulation.  A breathing condition in which breathing pauses or becomes shallow during sleep (sleep apnea). This condition can eventually cause the body to ventilate poorly and to hold onto carbon dioxide during the day. What increases the risk? You may have a greater risk for OHS if you:  Have a BMI of 30 or higher. BMI is an estimate of body fat that is calculated from height and weight. For adults, a BMI of 30 or higher is considered obese.  Are 40?32 years old.  Carry most of your excess weight around your waist.  Experience moderate symptoms of sleep apnea. What are the signs or symptoms? The most common symptoms of OHS are:  Daytime sleepiness.  Lack of energy.  Shortness of breath.  Morning headaches.  Sleep  apnea.  Trouble concentrating.  Irritability, mood swings, or depression.  Swollen veins in the neck.  Swelling of the legs. How is this diagnosed? Your health care provider may suspect OHS if you are obese and have poor breathing during the day and at night. Your health care provider will also do a physical exam. You may have tests to:  Measure your BMI.  Measure your blood oxygen level with a sensor placed on your finger (pulse oximetry).  Measure blood oxygen and carbon dioxide in a blood sample.  Measure the amount of red blood cells in a blood sample. OHS causes the number of red blood cells you have to increase (polycythemia).  Check your breathing ability (pulmonary function testing).  Check your breathing ability, breathing patterns, and oxygen level while you sleep (sleep study). You may also have a chest X-ray to rule out other breathing problems. You may have an electrocardiogram (ECG) and or echocardiogram to check for signs of heart failure. How is this treated? Weight loss is the most important part of treatment for OHS, and it may be the only treatment that you need. Other treatments may include:  Using a device to open your airway while you sleep, such as a continuous positive airway pressure (CPAP) machine that delivers oxygen to your airway through a mask.  Surgery (gastric bypass surgery) to lower your BMI. This may be needed if: ? You are very obese. ? Other treatments have not worked for you. ? Your OHS is very severe and is causing organ damage, such as heart failure. Follow these   instructions at home:  Medicines  Take over-the-counter and prescription medicines only as told by your health care provider.  Ask your health care provider what medicines are safe for you. You may be told to avoid medicines that can impair breathing and make OHS worse, such as sedatives and narcotics. Sleeping habits  If you are prescribed a CPAP machine, make sure you  understand and use the machine as directed.  Try to get 8 hours of sleep every night.  Go to bed at the same time every night, and get up at the same time every day. General instructions  Work with your health care provider to make a diet and exercise plan that helps you reach and maintain a healthy weight.  Eat a healthy diet.  Avoid smoking.  Exercise regularly as told by your health care provider.  During the evening, do not drink caffeine and do not eat heavy meals.  Keep all follow-up visits as told by your health care provider. This is important. Contact a health care provider if:  You experience new or worsening shortness of breath.  You have chest pain.  You have an irregular heartbeat (palpitations).  You have dizziness.  You faint.  You develop a cough.  You have a fever.  You have chest pain when you breathe (pleurisy). This information is not intended to replace advice given to you by your health care provider. Make sure you discuss any questions you have with your health care provider. Document Revised: 11/11/2018 Document Reviewed: 12/30/2015 Elsevier Patient Education  2020 Elsevier Inc. Screening for Sleep Apnea  Sleep apnea is a condition in which breathing pauses or becomes shallow during sleep. Sleep apnea screening is a test to determine if you are at risk for sleep apnea. The test is easy and only takes a few minutes. Your health care provider may ask you to have this test in preparation for surgery or as part of a physical exam. What are the symptoms of sleep apnea? Common symptoms of sleep apnea include:  Snoring.  Restless sleep.  Daytime sleepiness.  Pauses in breathing.  Choking during sleep.  Irritability.  Forgetfulness.  Trouble thinking clearly.  Depression.  Personality changes. Most people with sleep apnea are not aware that they have it. Why should I get screened? Getting screened for sleep apnea can help:  Ensure  your safety. It is important for your health care providers to know whether or not you have sleep apnea, especially if you are having surgery or have other long-term (chronic) health conditions.  Improve your health and allow you to get a better night's rest. Restful sleep can help you: ? Have more energy. ? Lose weight. ? Improve high blood pressure. ? Improve diabetes management. ? Prevent stroke. ? Prevent car accidents. How is screening done? Screening usually includes being asked a list of questions about your sleep quality. Some questions you may be asked include:  Do you snore?  Is your sleep restless?  Do you have daytime sleepiness?  Has a partner or spouse told you that you stop breathing during sleep?  Have you had trouble concentrating or memory loss? If your screening test is positive, you are at risk for the condition. Further testing may be needed to confirm a diagnosis of sleep apnea. Where to find more information You can find screening tools online or at your health care clinic. For more information about sleep apnea screening and healthy sleep, visit these websites:  Centers for Disease Control and   Prevention: www.cdc.gov/sleep/index.html  American Sleep Apnea Association: www.sleepapnea.org Contact a health care provider if:  You think that you may have sleep apnea. Summary  Sleep apnea screening can help determine if you are at risk for sleep apnea.  It is important for your health care providers to know whether or not you have sleep apnea, especially if you are having surgery or have other chronic health conditions.  You may be asked to take a screening test for sleep apnea in preparation for surgery or as part of a physical exam. This information is not intended to replace advice given to you by your health care provider. Make sure you discuss any questions you have with your health care provider. Document Revised: 05/06/2018 Document Reviewed:  10/30/2016 Elsevier Patient Education  2020 Elsevier Inc.  

## 2020-01-22 ENCOUNTER — Ambulatory Visit (INDEPENDENT_AMBULATORY_CARE_PROVIDER_SITE_OTHER): Payer: 59 | Admitting: Neurology

## 2020-01-22 DIAGNOSIS — G478 Other sleep disorders: Secondary | ICD-10-CM

## 2020-01-22 DIAGNOSIS — G4733 Obstructive sleep apnea (adult) (pediatric): Secondary | ICD-10-CM

## 2020-01-22 DIAGNOSIS — Z9189 Other specified personal risk factors, not elsewhere classified: Secondary | ICD-10-CM

## 2020-01-22 DIAGNOSIS — G4734 Idiopathic sleep related nonobstructive alveolar hypoventilation: Secondary | ICD-10-CM

## 2020-01-22 DIAGNOSIS — R0683 Snoring: Secondary | ICD-10-CM

## 2020-01-22 HISTORY — DX: Obstructive sleep apnea (adult) (pediatric): G47.33

## 2020-02-07 ENCOUNTER — Telehealth: Payer: Self-pay | Admitting: Neurology

## 2020-02-07 DIAGNOSIS — G4734 Idiopathic sleep related nonobstructive alveolar hypoventilation: Secondary | ICD-10-CM | POA: Insufficient documentation

## 2020-02-07 DIAGNOSIS — G4733 Obstructive sleep apnea (adult) (pediatric): Secondary | ICD-10-CM | POA: Insufficient documentation

## 2020-02-07 NOTE — Procedures (Signed)
NAME:  Todd Sandoval                                                             DOB: [birth year] MEDICAL RECORD No:  [id number]                                                   DOS: 01/22/2020 REFERRING PHYSICIAN: Delynn Flavin, DO STUDY PERFORMED: HST on [employer] HISTORY: Todd Sandoval is a 34 year- old Caucasian male patient, upon a referral on 01/09/2020, initiated by his wife, [name].  I have the pleasure of seeing Todd Sandoval today, a right -handed Caucasian male with possible OSA.  He has a medical history of Anxiety, Depression, and Super-Obesity.  Mr. Allsbrook confirms that he has gradually gained weight over the last 5 years there have been several little things that may have contributed along the way, but snoring has been present for longer than that -it is just getting more and more intense.  His wife has witnessed frequent apneas and it affects her sleep as she is worried about him.  His maximum weight was 318 pounds earlier this year.  He had sometimes been binge eating and for this reason was given Wellbutrin to help with his obsessive-compulsive behaviors and eating.   STUDY RESULTS:  Total Recording Time:  9h, ; Total Sleep Time:  8 h, 14 mins Total Apnea/Hypopnea Index (AHI): 68.5/h; RDI:  69.3/h; REM AHI: 60.9 /h Average Oxygen Saturation:  92 %; Lowest Oxygen Desaturation: 75 %  Total Time Oxygen Saturation Below or at 88 %: 49 minutes  Average Heart Rate: 73 bpm, bradycardia intermittently at 41 bpm lowest rate.  IMPRESSION: Severe sleep apnea, OSA type, at AHI of 68.5/h and with hypoxia. RECOMMENDATION: Auto CPAP device, setting between 6-18 cm water, 2 cm EPR and heated humidity, mask of patient's choice.  I certify that I have reviewed the raw data recording prior to the issuance of this report in accordance with the standards of the American Academy of Sleep Medicine (AASM). Melvyn Novas, M.D.    Medical Director of [facility] at [facility],  accredited by the AASM. Diplomat of the ABPN and ABSM.

## 2020-02-07 NOTE — Telephone Encounter (Signed)
-----   Message from Melvyn Novas, MD sent at 02/07/2020 11:53 AM EDT ----- IMPRESSION: Severe sleep apnea, OSA type, at AHI of 68.5/h and  with hypoxia. Bradycardia ,intermittently , at 41 bpm lowest rate.  RECOMMENDATION: Auto CPAP device, setting between 6-18 cm water,  2 cm EPR and heated humidity, mask of patient's choice.

## 2020-02-07 NOTE — Telephone Encounter (Signed)
I called pt and spoke with wife. I advised pt that Dr. Vickey Huger reviewed their sleep study results and found that pt has severe sleep apnea. Dr. Vickey Huger recommends that pt auto CPAP. I reviewed PAP compliance expectations with the pt. Pt is agreeable to starting a CPAP. I advised pt that an order will be sent to a DME, [facility] and [facility] will call the pt within about one week after they file with the pt's insurance. [facility]) will show the pt how to use the machine, fit for masks, and troubleshoot the CPAP if needed. A follow up appt was made for insurance purposes with Butch Penny, NP on Sept 9,2021 at 10:30 am. Pt verbalized understanding to arrive 15 minutes early and bring their CPAP. A letter with all of this information in it will be mailed to the pt as a reminder. I verified with the pt that the address we have on file is correct. Pt verbalized understanding of results. Pt had no questions at this time but was encouraged to call back if questions arise. I have sent the order to [facility]) and have received confirmation that they have received the order.

## 2020-02-07 NOTE — Progress Notes (Signed)
IMPRESSION: Severe sleep apnea, OSA type, at AHI of 68.5/h and  with hypoxia. Bradycardia ,intermittently , at 41 bpm lowest rate.  RECOMMENDATION: Auto CPAP device, setting between 6-18 cm water,  2 cm EPR and heated humidity, mask of patient's choice.

## 2020-02-07 NOTE — Addendum Note (Signed)
Addended by: Melvyn Novas on: 02/07/2020 11:54 AM   Modules accepted: Orders

## 2020-02-12 ENCOUNTER — Ambulatory Visit (INDEPENDENT_AMBULATORY_CARE_PROVIDER_SITE_OTHER): Payer: 59 | Admitting: Family Medicine

## 2020-02-12 ENCOUNTER — Encounter: Payer: Self-pay | Admitting: Family Medicine

## 2020-02-12 ENCOUNTER — Other Ambulatory Visit: Payer: Self-pay

## 2020-02-12 VITALS — BP 131/81 | HR 75 | Temp 97.4°F | Ht 67.0 in | Wt 306.0 lb

## 2020-02-12 DIAGNOSIS — F419 Anxiety disorder, unspecified: Secondary | ICD-10-CM | POA: Diagnosis not present

## 2020-02-12 DIAGNOSIS — F339 Major depressive disorder, recurrent, unspecified: Secondary | ICD-10-CM | POA: Diagnosis not present

## 2020-02-12 DIAGNOSIS — G4733 Obstructive sleep apnea (adult) (pediatric): Secondary | ICD-10-CM

## 2020-02-12 DIAGNOSIS — L723 Sebaceous cyst: Secondary | ICD-10-CM

## 2020-02-12 DIAGNOSIS — G4734 Idiopathic sleep related nonobstructive alveolar hypoventilation: Secondary | ICD-10-CM

## 2020-02-12 MED ORDER — BUPROPION HCL ER (XL) 150 MG PO TB24: 150.0000 mg | ORAL_TABLET | Freq: Every morning | ORAL | 3 refills | Status: DC

## 2020-02-12 NOTE — Progress Notes (Signed)
Subjective: CC: f/u obesity, depression PCP: Raliegh Ip, DO FWY:[id number] Todd Sandoval is Todd 34 y.o. male presenting to clinic today for:  1. Obesity/ depression Patient was started on Wellbutrin last visit.  He was instructed to continue his Celexa.  He notes that he has been doing extremely well with the Wellbutrin.  He has really been trying to modify lifestyle and has dropped quite Todd bit of carbohydrates from his diet.  He still occasionally indulges in smaller portions of things like macaroni and cheese but is no longer snacking frequently.  He usually drinks muscle milk each morning, has Todd P3 for lunch and Todd lower carb, lower fat supper.  He is not yet doing any structured exercise but is being physically active in his work.  He is increase his water intake quite Todd bit.  Not having any adverse side effects from the Wellbutrin.  His wife has started following Todd diet with him, which has been helpful.  He was diagnosed with severe sleep apnea and will be getting set up for his CPAP supplies soon.  He is looking forward to this as he admits that his sleep has been quite poor for many years.  2.  Lump under armpit Patient reports Todd left-sided lump under his armpit.  It seems to wax and wane in size.  He noticed it when he was applying deodorant.  No drainage, significant pain.  No redness.  He does not shave.   ROS: Per HPI  No Known Allergies Past Medical History:  Diagnosis Date   Anxiety    Depression    Obesity     Current Outpatient Medications:    b complex vitamins tablet, Take 1 tablet by mouth daily., Disp: , Rfl:    buPROPion (WELLBUTRIN XL) 150 MG 24 hr tablet, Take 1 tablet (150 mg total) by mouth in the morning., Disp: 90 tablet, Rfl: 0   citalopram (CELEXA) 20 MG tablet, Take 1 tablet (20 mg total) by mouth daily., Disp: 90 tablet, Rfl: 3   fexofenadine (ALLEGRA) 180 MG tablet, Take 180 mg by mouth daily., Disp: , Rfl:  Social History   Socioeconomic  History   Marital status: Married    Spouse name: [name]   Number of children: 4   Years of education: 12   Highest education level: Not on file  Occupational History   Occupation: unemployed    Employer: [employer]    Comment: works on [employer] shift  Tobacco Use   Smoking status: Never Smoker   Smokeless tobacco: Never Used  Building services engineer Use: Never used  Substance and Sexual Activity   Alcohol use: No   Drug use: Not on file   Sexual activity: Yes  Other Topics Concern   Not on file  Social History Narrative   Lives with wife and four children   Caffeine use: 30 oz per day   Right handed   Social Determinants of Health   Financial Resource Strain:    Difficulty of Paying Living Expenses:   Food Insecurity:    Worried About Programme researcher, broadcasting/film/video in the Last Year:    Barista in the Last Year:   Transportation Needs:    Freight forwarder (Medical):    Lack of Transportation (Non-Medical):   Physical Activity:    Days of Exercise per Week:    Minutes of Exercise per Session:   Stress:    Feeling of Stress :  Social Connections:    Frequency of Communication with Friends and Family:    Frequency of Social Gatherings with Friends and Family:    Attends Religious Services:    Active Member of Clubs or Organizations:    Attends Engineer, structural:    Marital Status:   Intimate Partner Violence:    Fear of Current or Ex-Partner:    Emotionally Abused:    Physically Abused:    Sexually Abused:    Family History  Problem Relation Age of Onset   Hyperlipidemia Father    Early death Father    Heart disease Father    Skin cancer Father    Bipolar disorder Mother    Early death Paternal Grandfather    Heart disease Paternal Grandfather     Objective: Office vital signs reviewed. BP 131/81    Pulse 75    Temp (!) 97.4 F (36.3 C) (Temporal)    Ht 5\' 7"  (1.702 m)    Wt (!) 306 lb  (138.8 kg)    SpO2 97%    BMI 47.93 kg/m   Physical Examination:  General: Awake, alert, well appearing, No acute distress HEENT: Normal, sclera white Cardio: regular rate   Pulm: normal WOB on room air Axilla: Left axilla with Todd small, palpable sebaceous cyst.  No appreciable pore, drainage, fluctuance or induration.  Nontender to palpation.  No palpable lymphadenopathy  Assessment/ Plan: 34 y.o. male   1. Depression, recurrent (HCC) Controlled - buPROPion (WELLBUTRIN XL) 150 MG 24 hr tablet; Take 1 tablet (150 mg total) by mouth in the morning.  Dispense: 90 tablet; Refill: 3  2. Anxiety Stable and controlled  3. Morbid obesity (HCC) Has had successful 12 pound weight loss since her last visit.  I congratulated him on the lifestyle changes.  Continue Wellbutrin.  I would like to see him back in 3 months at which point we will plan to repeat Todd fasting lipid panel - buPROPion (WELLBUTRIN XL) 150 MG 24 hr tablet; Take 1 tablet (150 mg total) by mouth in the morning.  Dispense: 90 tablet; Refill: 3  4. Sebaceous cyst of left axilla No complications.  We discussed the nature of these lesions and reasons for reevaluation.  He voiced good understanding.  Handout provided  5. Chronic intermittent hypoxia with obstructive sleep apnea To start CPAP soon.   No orders of the defined types were placed in this encounter.  No orders of the defined types were placed in this encounter.    36, DO [facility] [phone]

## 2020-02-12 NOTE — Patient Instructions (Addendum)
Lets plan for repeat cholesterol in 3 months.  AWESOME JOB!   Epidermal Cyst (sebaceous cyst)  An epidermal cyst is a sac made of skin tissue. The sac contains a substance called keratin. Keratin is a protein that is normally secreted through the hair follicles. When keratin becomes trapped in the top layer of skin (epidermis), it can form an epidermal cyst. Epidermal cysts can be found anywhere on your body. These cysts are usually harmless (benign), and they may not cause symptoms unless they become infected. What are the causes? This condition may be caused by:  A blocked hair follicle.  A hair that curls and re-enters the skin instead of growing straight out of the skin (ingrown hair).  A blocked pore.  Irritated skin.  An injury to the skin.  Certain conditions that are passed along from parent to child (inherited).  Human papillomavirus (HPV).  Long-term (chronic) sun damage to the skin. What increases the risk? The following factors may make you more likely to develop an epidermal cyst:  Having acne.  Being overweight.  Being 47-31 years old. What are the signs or symptoms? The only symptom of this condition may be a small, painless lump underneath the skin. When an epidermal cyst ruptures, it may become infected. Symptoms may include:  Redness.  Inflammation.  Tenderness.  Warmth.  Fever.  Keratin draining from the cyst. Keratin is grayish-white, bad-smelling substance.  Pus draining from the cyst. How is this diagnosed? This condition is diagnosed with a physical exam.  In some cases, you may have a sample of tissue (biopsy) taken from your cyst to be examined under a microscope or tested for bacteria.  You may be referred to a health care provider who specializes in skin care (dermatologist). How is this treated? In many cases, epidermal cysts go away on their own without treatment. If a cyst becomes infected, treatment may include:  Opening and  draining the cyst, done by a health care provider. After draining, minor surgery to remove the rest of the cyst may be done.  Antibiotic medicine.  Injections of medicines (steroids) that help to reduce inflammation.  Surgery to remove the cyst. Surgery may be done if the cyst: ? Becomes large. ? Bothers you. ? Has a chance of turning into cancer.  Do not try to open a cyst yourself. Follow these instructions at home:  Take over-the-counter and prescription medicines only as told by your health care provider.  If you were prescribed an antibiotic medicine, take it it as told by your health care provider. Do not stop using the antibiotic even if you start to feel better.  Keep the area around your cyst clean and dry.  Wear loose, dry clothing.  Avoid touching your cyst.  Check your cyst every day for signs of infection. Check for: ? Redness, swelling, or pain. ? Fluid or blood. ? Warmth. ? Pus or a bad smell.  Keep all follow-up visits as told by your health care provider. This is important. How is this prevented?  Wear clean, dry, clothing.  Avoid wearing tight clothing.  Keep your skin clean and dry. Take showers or baths every day. Contact a health care provider if:  Your cyst develops symptoms of infection.  Your condition is not improving or is getting worse.  You develop a cyst that looks different from other cysts you have had.  You have a fever. Get help right away if:  Redness spreads from the cyst into the surrounding area.  Summary  An epidermal cyst is a sac made of skin tissue. These cysts are usually harmless (benign), and they may not cause symptoms unless they become infected.  If a cyst becomes infected, treatment may include surgery to open and drain the cyst, or to remove it. Treatment may also include medicines by mouth or through an injection.  Take over-the-counter and prescription medicines only as told by your health care provider. If you  were prescribed an antibiotic medicine, take it as told by your health care provider. Do not stop using the antibiotic even if you start to feel better.  Contact a health care provider if your condition is not improving or is getting worse.  Keep all follow-up visits as told by your health care provider. This is important. This information is not intended to replace advice given to you by your health care provider. Make sure you discuss any questions you have with your health care provider. Document Revised: 11/10/2018 Document Reviewed: 01/31/2018 Elsevier Patient Education  2020 [employer].

## 2020-04-11 ENCOUNTER — Ambulatory Visit: Payer: Self-pay | Admitting: Adult Health

## 2020-05-14 ENCOUNTER — Ambulatory Visit: Payer: 59 | Admitting: Family Medicine

## 2020-05-20 ENCOUNTER — Ambulatory Visit: Payer: 59 | Admitting: Family Medicine

## 2020-06-02 DIAGNOSIS — H5213 Myopia, bilateral: Secondary | ICD-10-CM | POA: Diagnosis not present

## 2020-06-05 ENCOUNTER — Ambulatory Visit: Payer: 59 | Admitting: Family Medicine

## 2020-06-07 ENCOUNTER — Encounter: Payer: Self-pay | Admitting: Family Medicine

## 2020-07-04 ENCOUNTER — Telehealth: Payer: Self-pay

## 2020-07-04 NOTE — Telephone Encounter (Signed)
Sure that is fine to go ahead and write him a note to wear a face shield

## 2020-07-05 NOTE — Telephone Encounter (Signed)
Note up front, pt aware 

## 2020-07-10 NOTE — Telephone Encounter (Signed)
Revised letter and placed up front for patient pick up. Patient notified

## 2020-07-10 NOTE — Telephone Encounter (Signed)
Employer needs more information as to why patient can't wear mask. When he wears the mask it causes him to have panic attacks and can't breathe. Please call patient and advise.

## 2020-07-24 ENCOUNTER — Other Ambulatory Visit: Payer: Self-pay

## 2020-07-24 ENCOUNTER — Ambulatory Visit (INDEPENDENT_AMBULATORY_CARE_PROVIDER_SITE_OTHER): Payer: Managed Care, Other (non HMO) | Admitting: Family Medicine

## 2020-07-24 ENCOUNTER — Encounter: Payer: Self-pay | Admitting: Family Medicine

## 2020-07-24 NOTE — Progress Notes (Signed)
Subjective: CC: Obesity PCP: Raliegh Ip, DO JQB:[id number] A Maffeo is a 34 y.o. male presenting to clinic today for:  1.  Obesity Patient reports that he discontinued the Wellbutrin, despite it really helping with weight loss, due to mood disturbances which were observed by his wife.  He was on the medication for about a month before discontinuing.  He admits that he has since gone back to his old ways with poor dietary choices and has not been going back to the gym over the last several weeks.  He attributes much of his recent weight gain to the holidays.  He is on third shift as well and finds that this has been somewhat stressful.  He really wants to try and change his lifestyle.  He has plans to start this the first of the year and get back into the [employer].  He has been having some left ankle pain but thinks that this is because he did something to it.  Does not report of any knee pain or back pain today.   ROS: Per HPI  No Known Allergies Past Medical History:  Diagnosis Date  . Anxiety   . Depression   . Obesity     Current Outpatient Medications:  .  b complex vitamins tablet, Take 1 tablet by mouth daily., Disp: , Rfl:  .  citalopram (CELEXA) 20 MG tablet, Take 1 tablet (20 mg total) by mouth daily., Disp: 90 tablet, Rfl: 3 .  fexofenadine (ALLEGRA) 180 MG tablet, Take 180 mg by mouth daily., Disp: , Rfl:  .  buPROPion (WELLBUTRIN XL) 150 MG 24 hr tablet, Take 1 tablet (150 mg total) by mouth in the morning. (Patient not taking: Reported on 07/24/2020), Disp: 90 tablet, Rfl: 3 Social History   Socioeconomic History  . Marital status: Married    Spouse name: [name]  . Number of children: 4  . Years of education: 18  . Highest education level: Not on file  Occupational History  . Occupation: unemployed    Employer: [employer]    Comment: works on [employer] shift  Tobacco Use  . Smoking status: Never Smoker  . Smokeless tobacco: Never  Used  Vaping Use  . Vaping Use: Never used  Substance and Sexual Activity  . Alcohol use: No  . Drug use: Not on file  . Sexual activity: Yes  Other Topics Concern  . Not on file  Social History Narrative   Lives with wife and four children   Caffeine use: 30 oz per day   Right handed   Social Determinants of Health   Financial Resource Strain: Not on file  Food Insecurity: Not on file  Transportation Needs: Not on file  Physical Activity: Not on file  Stress: Not on file  Social Connections: Not on file  Intimate Partner Violence: Not on file   Family History  Problem Relation Age of Onset  . Hyperlipidemia Father   . Early death Father   . Heart disease Father   . Skin cancer Father   . Bipolar disorder Mother   . Early death Paternal Grandfather   . Heart disease Paternal Grandfather     Objective: Office vital signs reviewed. BP 131/84   Pulse 91   Temp 98.6 F (37 C)   Ht 5\' 7"  (1.702 m)   Wt (!) 314 lb 12.8 oz (142.8 kg)   SpO2 96%   BMI 49.30 kg/m   Physical Examination:  General: Awake, alert, morbidly  obese well nourished, No acute distress HEENT: Normal; MMM Cardio: regular rate and rhythm, S1S2 heard, no murmurs appreciated Pulm: clear to auscultation bilaterally, no wheezes, rhonchi or rales; normal work of breathing on room air  Depression screen [facility] 2/9 07/24/2020 02/12/2020 12/22/2019  Decreased Interest 1 0 1  Down, Depressed, Hopeless 1 1 1   PHQ - 2 Score 2 1 2   Altered sleeping 0 0 3  Tired, decreased energy 1 1 3   Change in appetite 1 1 2   Feeling bad or failure about yourself  0 0 1  Trouble concentrating 0 0 0  Moving slowly or fidgety/restless 0 0 0  Suicidal thoughts 0 0 0  PHQ-9 Score 4 3 11   Difficult doing work/chores Not difficult at all Not difficult at all Somewhat difficult  Some recent data might be hidden   GAD 7 : Generalized Anxiety Score 07/24/2020 02/12/2020 11/25/2018 10/05/2018  Nervous, Anxious, on Edge 1 1 1 1    Control/stop worrying 0 0 0 1  Worry too much - different things 0 0 0 0  Trouble relaxing 1 0 0 0  Restless 0 0 0 0  Easily annoyed or irritable 1 0 0 1  Afraid - awful might happen 1 0 0 1  Total GAD 7 Score 4 1 1 4   Anxiety Difficulty Not difficult at all Not difficult at all Not difficult at all -   Assessment/ Plan: 34 y.o. male   Morbid obesity (HCC)  He wants to really pursue lifestyle modification before additional pharmacologic therapy.  I did give him information to further evaluate both Vyvanse and Saxenda as possible pharmacologic interventions.  I would like to see him back in about 2 to 3 months for recheck.  He will see me sooner if needed  No orders of the defined types were placed in this encounter.  No orders of the defined types were placed in this encounter.    07/26/2020, DO [facility] [phone]

## 2020-07-24 NOTE — Patient Instructions (Signed)
Vyvanse indicated in binge eating Saxenda indicated in obesity (injectable)  See me in March for recheck.

## 2020-10-22 ENCOUNTER — Telehealth: Payer: Self-pay

## 2020-10-23 ENCOUNTER — Other Ambulatory Visit: Payer: Self-pay | Admitting: Family Medicine

## 2020-10-23 DIAGNOSIS — F339 Major depressive disorder, recurrent, unspecified: Secondary | ICD-10-CM

## 2020-10-23 DIAGNOSIS — F419 Anxiety disorder, unspecified: Secondary | ICD-10-CM

## 2020-10-23 MED ORDER — CITALOPRAM HYDROBROMIDE 40 MG PO TABS: 40.0000 mg | ORAL_TABLET | Freq: Every day | ORAL | 3 refills | Status: DC

## 2020-11-01 NOTE — Telephone Encounter (Signed)
Done on 03/23

## 2020-12-09 ENCOUNTER — Telehealth: Payer: Self-pay

## 2020-12-09 NOTE — Telephone Encounter (Signed)
Have you seen any orders from [employer] for CPAP supplies

## 2020-12-10 NOTE — Telephone Encounter (Signed)
Form came in today & laced on providers desk

## 2020-12-12 NOTE — Telephone Encounter (Signed)
Form faxed back on 12/11/20

## 2020-12-21 IMAGING — DX DG ANKLE COMPLETE 3+V*R*
3 series · 3 of 3 positions shown · non-contrast
Comparison: None

CLINICAL DATA: Injured ankle 8 days ago. Persistent pain.

EXAM:
RIGHT ANKLE - COMPLETE 3+ VIEW

[ankle ap]
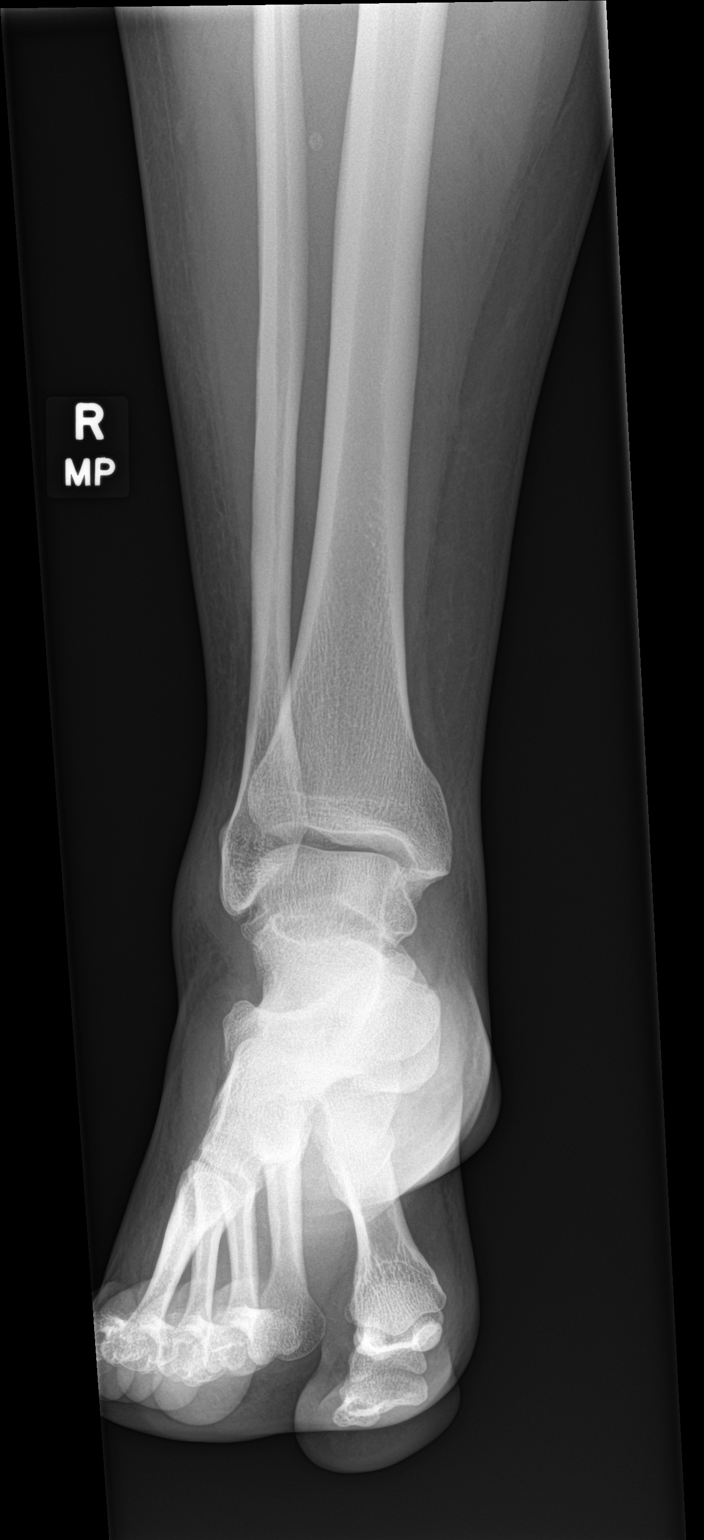

[ankle obl]
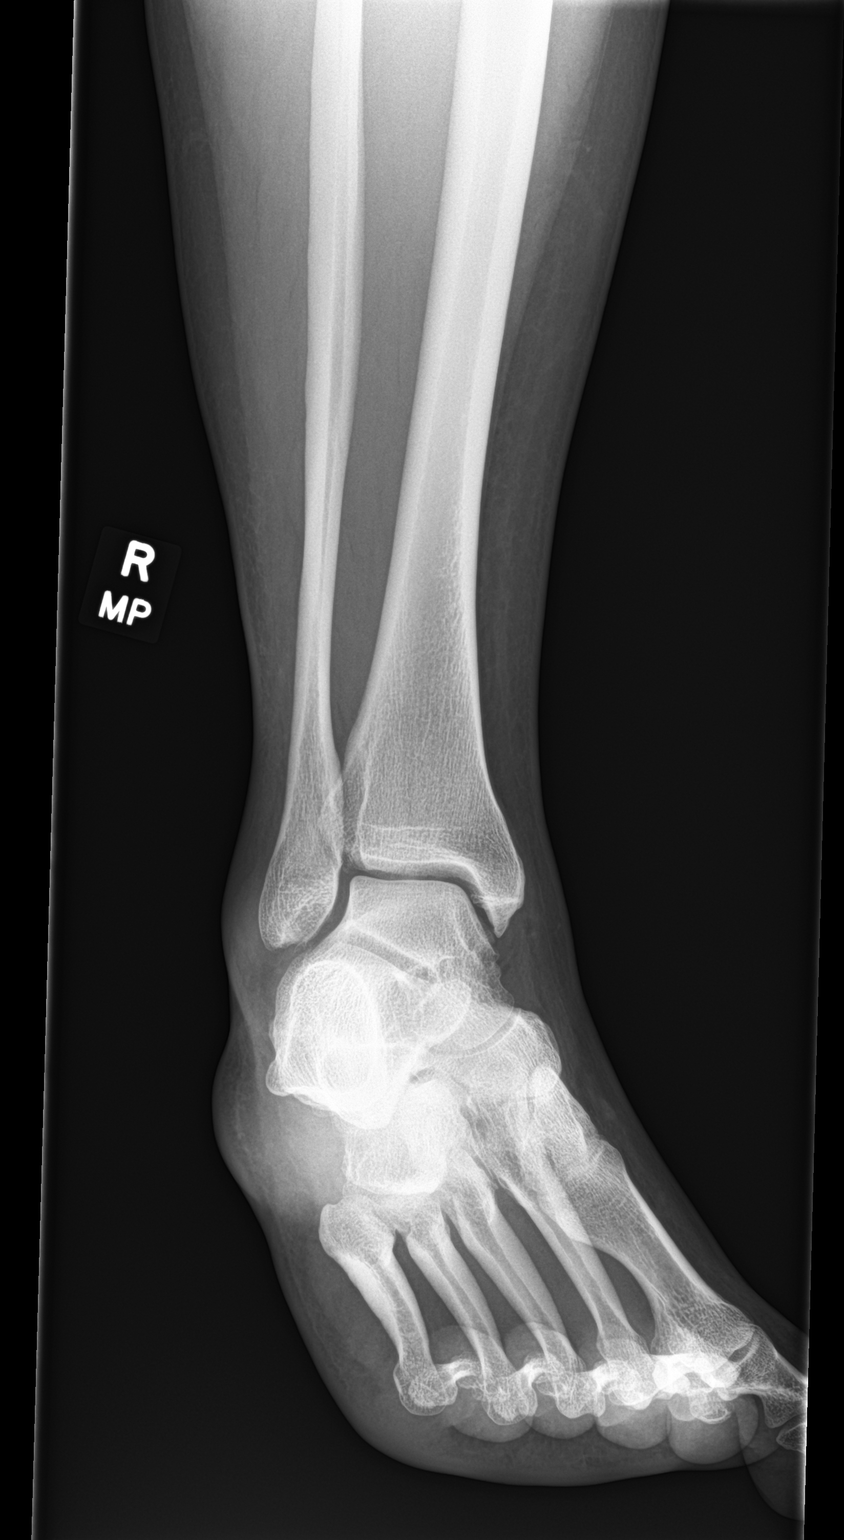

[ankle lat]
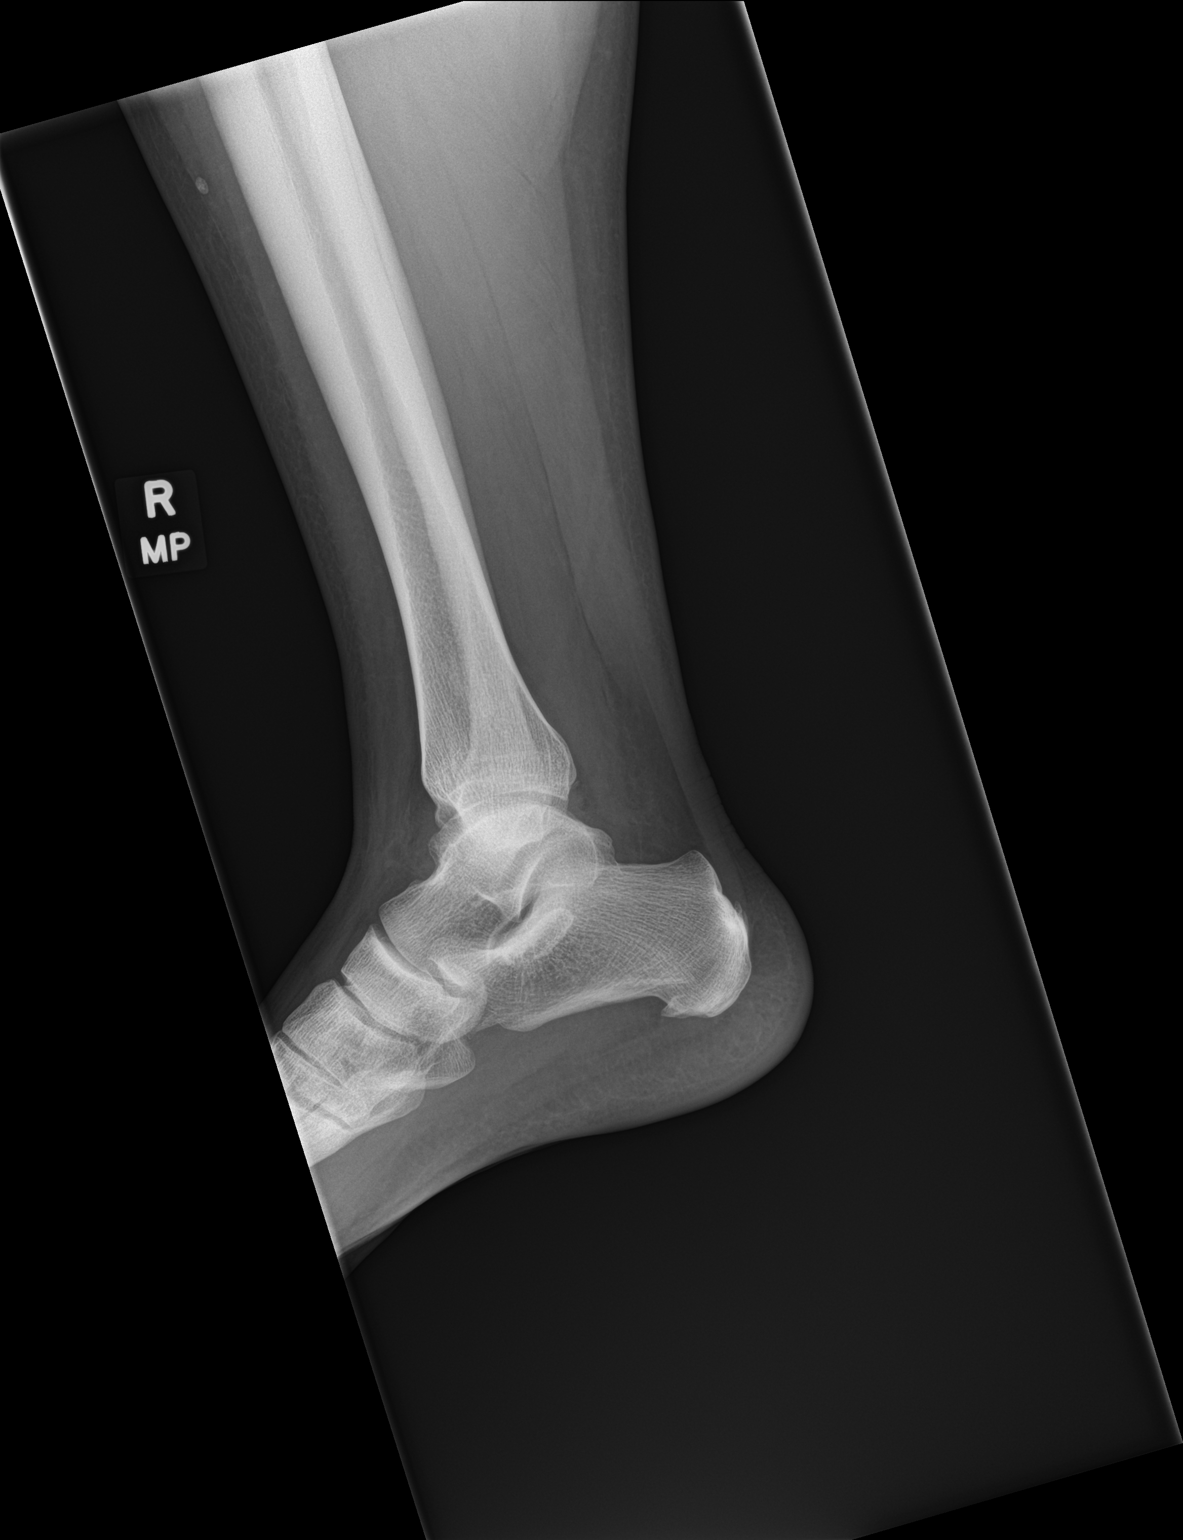

[3 of 3 positions shown; findings below may reference images not displayed]

FINDINGS: The ankle mortise is maintained. No acute ankle fracture is
identified. The visualized mid and hindfoot bony structures are
intact. A small calcaneal heel spur is noted.
IMPRESSION: No acute bony findings.

## 2021-03-06 ENCOUNTER — Ambulatory Visit (INDEPENDENT_AMBULATORY_CARE_PROVIDER_SITE_OTHER): Payer: Medicaid Other | Admitting: Family Medicine

## 2021-03-06 ENCOUNTER — Encounter: Payer: Self-pay | Admitting: Family Medicine

## 2021-03-06 DIAGNOSIS — Z20818 Contact with and (suspected) exposure to other bacterial communicable diseases: Secondary | ICD-10-CM

## 2021-03-06 MED ORDER — AZITHROMYCIN 250 MG PO TABS: ORAL_TABLET | ORAL | 0 refills | Status: DC

## 2021-03-06 NOTE — Progress Notes (Signed)
    Subjective:    Patient ID: Todd Sandoval, male    DOB: 09/21/1985, 35 y.o.   MRN: [id number]   HPI: Todd Sandoval is a 35 y.o. male presenting for exposed to strep. His kids have symptoms. They in turn have three known exposures via a play group in  which three kids were dxed with strep.   Depression screen [facility] 2/9 07/24/2020 02/12/2020 12/22/2019 12/22/2019 12/12/2019  Decreased Interest 1 0 1 0 0  Down, Depressed, Hopeless 1 1 1  0 0  PHQ - 2 Score 2 1 2  0 0  Altered sleeping 0 0 3 - -  Tired, decreased energy 1 1 3  - -  Change in appetite 1 1 2  - -  Feeling bad or failure about yourself  0 0 1 - -  Trouble concentrating 0 0 0 - -  Moving slowly or fidgety/restless 0 0 0 - -  Suicidal thoughts 0 0 0 - -  PHQ-9 Score 4 3 11  - -  Difficult doing work/chores Not difficult at all Not difficult at all Somewhat difficult - -  Some recent data might be hidden     Relevant past medical, surgical, family and social history reviewed and updated as indicated.  Interim medical history since our last visit reviewed. Allergies and medications reviewed and updated.  ROS: 4 Review of Systems  Neg except as per HPI Social History   Tobacco Use  Smoking Status Never  Smokeless Tobacco Never       Objective:     Wt Readings from Last 3 Encounters:  07/24/20 (!) 314 lb 12.8 oz (142.8 kg)  02/12/20 (!) 306 lb (138.8 kg)  01/09/20 (!) 313 lb (142 kg)     Exam deferred. Pt. Harboring due to COVID 19. Phone visit performed.   Assessment & Plan:   1. Strep throat exposure     Meds ordered this encounter  Medications   azithromycin (ZITHROMAX Z-PAK) 250 MG tablet    Sig: Take two right away Then one a day for the next 4 days.    Dispense:  6 each    Refill:  0    No orders of the defined types were placed in this encounter.     Diagnoses and all orders for this visit:  Strep throat exposure  Other orders -     azithromycin (ZITHROMAX Z-PAK) 250 MG tablet;  Take two right away Then one a day for the next 4 days.   Virtual Visit via telephone Note  I discussed the limitations, risks, security and privacy concerns of performing an evaluation and management service by telephone and the availability of in person appointments. The patient was identified with two identifiers. Pt.expressed understanding and agreed to proceed. Pt. Is at home. Dr. is in his office.  Follow Up Instructions:   I discussed the assessment and treatment plan with the patient. The patient was provided an opportunity to ask questions and all were answered. The patient agreed with the plan and demonstrated an understanding of the instructions.   The patient was advised to call back or seek an in-person evaluation if the symptoms worsen or if the condition fails to improve as anticipated.   Total minutes including chart review and phone contact time: 4   Follow up plan: No follow-ups on file.  , MD 07/26/20 [facility]

## 2021-06-17 ENCOUNTER — Other Ambulatory Visit: Payer: Self-pay

## 2021-06-17 ENCOUNTER — Ambulatory Visit: Payer: Medicaid Other | Admitting: Family Medicine

## 2021-06-17 ENCOUNTER — Encounter: Payer: Self-pay | Admitting: Family Medicine

## 2021-06-17 VITALS — BP 128/82 | HR 73 | Temp 98.3°F | Ht 67.0 in | Wt 337.6 lb

## 2021-06-17 DIAGNOSIS — F339 Major depressive disorder, recurrent, unspecified: Secondary | ICD-10-CM

## 2021-06-17 DIAGNOSIS — F419 Anxiety disorder, unspecified: Secondary | ICD-10-CM | POA: Diagnosis not present

## 2021-06-17 DIAGNOSIS — Z818 Family history of other mental and behavioral disorders: Secondary | ICD-10-CM

## 2021-06-17 NOTE — Progress Notes (Signed)
Subjective: CC: Depression and anxiety PCP: Raliegh Ip, DO KNL:[id number] A Zalewski is a 35 y.o. male presenting to clinic today for:  1.  Depression and anxiety, moodiness Patient has been on Wellbutrin, Celexa.  Initially these medications seem to be very helpful but over time they stopped working.  He has been off of the Wellbutrin for a while now.  We advanced his Celexa to 40 mg in efforts to improve his symptoms but he notes that the moodiness seemed to worsen after that advancement in dose.  He is here today to talk about alternatives.  His wife really has noticed a huge difference in his mood.  He feels "down and out".  He reports bad mood swings and often feels too tired to engage or help at home.  He feels guilty because he feels like he is burdening his wife.  He is of the opinion that he should be the one caring for the family and because he is not doing this it makes him feel bad.  He has quit 2 jobs and is not currently working.  This weighs on him.  There is a family history of bipolar depression in his mother, she was diagnosed apparently in her 30s.   ROS: Per HPI  No Known Allergies Past Medical History:  Diagnosis Date   Anxiety    Depression    Obesity     Current Outpatient Medications:    b complex vitamins tablet, Take 1 tablet by mouth daily., Disp: , Rfl:    citalopram (CELEXA) 40 MG tablet, Take 1 tablet (40 mg total) by mouth daily., Disp: 90 tablet, Rfl: 3   fexofenadine (ALLEGRA) 180 MG tablet, Take 180 mg by mouth daily., Disp: , Rfl:    buPROPion (WELLBUTRIN XL) 150 MG 24 hr tablet, Take 1 tablet (150 mg total) by mouth in the morning. (Patient not taking: No sig reported), Disp: 90 tablet, Rfl: 3 Social History   Socioeconomic History   Marital status: Married    Spouse name: [name]   Number of children: 4   Years of education: 12   Highest education level: Not on file  Occupational History   Occupation: unemployed    Employer: [employer]    Comment: works on [employer] shift  Tobacco Use   Smoking status: Never   Smokeless tobacco: Never  Vaping Use   Vaping Use: Never used  Substance and Sexual Activity   Alcohol use: No   Drug use: Not on file   Sexual activity: Yes  Other Topics Concern   Not on file  Social History Narrative   Lives with wife and four children   Caffeine use: 30 oz per day   Right handed   Social Determinants of Health   Financial Resource Strain: Not on file  Food Insecurity: Not on file  Transportation Needs: Not on file  Physical Activity: Not on file  Stress: Not on file  Social Connections: Not on file  Intimate Partner Violence: Not on file   Family History  Problem Relation Age of Onset   Hyperlipidemia Father    Early death Father    Heart disease Father    Skin cancer Father    Bipolar disorder Mother    Early death Paternal Grandfather    Heart disease Paternal Grandfather     Objective: Office vital signs reviewed. BP 128/82   Pulse 73   Temp 98.3 F (36.8 C)   Ht 5\' 7"  (1.702 m)  Wt (!) 337 lb 9.6 oz (153.1 kg)   SpO2 98%   BMI 52.88 kg/m   Physical Examination:  General: Awake, alert, well nourished, No acute distress Psych: Depressed. Depression screen [facility] 2/9 06/17/2021 07/24/2020 02/12/2020  Decreased Interest 2 1 0  Down, Depressed, Hopeless 3 1 1   PHQ - 2 Score 5 2 1   Altered sleeping 2 0 0  Tired, decreased energy 3 1 1   Change in appetite 2 1 1   Feeling bad or failure about yourself  2 0 0  Trouble concentrating 1 0 0  Moving slowly or fidgety/restless 1 0 0  Suicidal thoughts 1 0 0  PHQ-9 Score 17 4 3   Difficult doing work/chores Somewhat difficult Not difficult at all Not difficult at all  Some recent data might be hidden   GAD 7 : Generalized Anxiety Score 06/17/2021 07/24/2020 02/12/2020 11/25/2018  Nervous, Anxious, on Edge 1 1 1 1   Control/stop worrying 1 0 0 0  Worry too much - different things 1 0 0 0  Trouble  relaxing 2 1 0 0  Restless 0 0 0 0  Easily annoyed or irritable 2 1 0 0  Afraid - awful might happen 0 1 0 0  Total GAD 7 Score 7 4 1 1   Anxiety Difficulty Somewhat difficult Not difficult at all Not difficult at all Not difficult at all   Assessment/ Plan: 35 y.o. male   Depression, recurrent (HCC)  Anxiety  Family history of bipolar disorder  Uncontrolled depression and anxiety that is refractory to multiple medications at this point.  I am advising him to start wean from Celexa at 20 mg daily.  He has an appointment on Thursday scheduled for GeneSight testing with 07/26/2020.  Further plan pending this but I am in favor of consideration for treatment for a bipolar depression given strong family history of bipolar disorder and his refractory symptoms to SSRI class and Wellbutrin.  He was amenable to this.  Of course would have to be very careful not to place him on medications that would promote obesity as he already struggles with his weight.  I offered counseling services but given lack of insurance he is very hesitant to start anything.  He will contact me should he change his mind we can offer at least behavioral services through virtual behavioral health.  No orders of the defined types were placed in this encounter.  No orders of the defined types were placed in this encounter.  Total time spent with patient 32 minutes.  Greater than 50% of encounter spent in coordination of care/counseling.   04/14/2020, DO [facility] [phone]

## 2021-06-19 ENCOUNTER — Other Ambulatory Visit: Payer: Self-pay

## 2021-06-19 ENCOUNTER — Encounter: Payer: Self-pay | Admitting: Family Medicine

## 2021-06-19 ENCOUNTER — Ambulatory Visit (INDEPENDENT_AMBULATORY_CARE_PROVIDER_SITE_OTHER): Payer: Medicaid Other | Admitting: Family Medicine

## 2021-06-19 VITALS — BP 128/75 | HR 73 | Temp 98.2°F | Ht 67.0 in | Wt 337.6 lb

## 2021-06-19 DIAGNOSIS — F419 Anxiety disorder, unspecified: Secondary | ICD-10-CM | POA: Diagnosis not present

## 2021-06-19 DIAGNOSIS — F339 Major depressive disorder, recurrent, unspecified: Secondary | ICD-10-CM | POA: Diagnosis not present

## 2021-06-19 NOTE — Progress Notes (Signed)
Assessment & Plan:  1-2. Depression, recurrent (HCC)/Anxiety Uncontrolled. Gene Sight testing completed today.   Follow up plan: Return as scheduled with PCP.  Todd Boston, MSN, APRN, FNP-C [facility]  Subjective:   Patient ID: Todd Sandoval, male    DOB: 1985/12/25, 35 y.o.   MRN: [id number]  HPI: Todd Sandoval is a 35 y.o. male presenting on 06/19/2021 for genesight  Patient is here for Gene Sight testing for uncontrolled anxiety and depression. He has stopped Wellbutrin and has continued Celexa, but does not feel controlled.  Depression screen [facility] 2/9 06/19/2021 06/17/2021 07/24/2020  Decreased Interest 2 2 1   Down, Depressed, Hopeless 2 3 1   PHQ - 2 Score 4 5 2   Altered sleeping 1 2 0  Tired, decreased energy 2 3 1   Change in appetite 1 2 1   Feeling bad or failure about yourself  2 2 0  Trouble concentrating 1 1 0  Moving slowly or fidgety/restless 0 1 0  Suicidal thoughts 1 1 0  PHQ-9 Score 12 17 4   Difficult doing work/chores Very difficult Somewhat difficult Not difficult at all  Some recent data might be hidden   GAD 7 : Generalized Anxiety Score 06/19/2021 06/17/2021 07/24/2020 02/12/2020  Nervous, Anxious, on Edge 2 1 1 1   Control/stop worrying 2 1 0 0  Worry too much - different things 2 1 0 0  Trouble relaxing 1 2 1  0  Restless 1 0 0 0  Easily annoyed or irritable 2 2 1  0  Afraid - awful might happen 1 0 1 0  Total GAD 7 Score 11 7 4 1   Anxiety Difficulty Very difficult Somewhat difficult Not difficult at all Not difficult at all    ROS: Negative unless specifically indicated above in HPI.   Relevant past medical history reviewed and updated as indicated.   Allergies and medications reviewed and updated.   Current Outpatient Medications:    b complex vitamins tablet, Take 1 tablet by mouth daily., Disp: , Rfl:    buPROPion (WELLBUTRIN XL) 150 MG 24 hr tablet, Take 1 tablet (150 mg total) by mouth in the  morning., Disp: 90 tablet, Rfl: 3   citalopram (CELEXA) 40 MG tablet, Take 1 tablet (40 mg total) by mouth daily., Disp: 90 tablet, Rfl: 3   fexofenadine (ALLEGRA) 180 MG tablet, Take 180 mg by mouth daily., Disp: , Rfl:   No Known Allergies  Objective:   BP 128/75   Pulse 73   Temp 98.2 F (36.8 C) (Temporal)   Ht 5\' 7"  (1.702 m)   Wt (!) 337 lb 9.6 oz (153.1 kg)   BMI 52.88 kg/m    Physical Exam Vitals reviewed.  Constitutional:      General: He is not in acute distress.    Appearance: Normal appearance. He is not ill-appearing, toxic-appearing or diaphoretic.  HENT:     Head: Normocephalic and atraumatic.  Eyes:     General: No scleral icterus.       Right eye: No discharge.        Left eye: No discharge.     Conjunctiva/sclera: Conjunctivae normal.  Cardiovascular:     Rate and Rhythm: Normal rate.  Pulmonary:     Effort: Pulmonary effort is normal. No respiratory distress.  Musculoskeletal:        General: Normal range of motion.     Cervical back: Normal range of motion.  Skin:    General: Skin is warm and dry.  Neurological:     Mental Status: He is alert and oriented to person, place, and time. Mental status is at baseline.  Psychiatric:        Mood and Affect: Mood normal.        Behavior: Behavior normal.        Thought Content: Thought content normal.        Judgment: Judgment normal.

## 2021-06-24 ENCOUNTER — Other Ambulatory Visit: Payer: Self-pay | Admitting: Family Medicine

## 2021-06-24 DIAGNOSIS — Z20828 Contact with and (suspected) exposure to other viral communicable diseases: Secondary | ICD-10-CM

## 2021-06-24 MED ORDER — OSELTAMIVIR PHOSPHATE 75 MG PO CAPS: 75.0000 mg | ORAL_CAPSULE | Freq: Every day | ORAL | 0 refills | Status: AC

## 2021-07-02 ENCOUNTER — Encounter: Payer: Self-pay | Admitting: Family Medicine

## 2021-07-10 ENCOUNTER — Ambulatory Visit: Payer: Medicaid Other | Admitting: Family Medicine

## 2021-07-10 DIAGNOSIS — F339 Major depressive disorder, recurrent, unspecified: Secondary | ICD-10-CM | POA: Diagnosis not present

## 2021-07-10 MED ORDER — DESVENLAFAXINE SUCCINATE ER 50 MG PO TB24: 50.0000 mg | ORAL_TABLET | Freq: Every day | ORAL | 0 refills | Status: DC

## 2021-07-10 MED ORDER — CITALOPRAM HYDROBROMIDE 10 MG PO TABS
10.0000 mg | ORAL_TABLET | Freq: Every day | ORAL | 0 refills | Status: DC
Start: 2021-07-10 — End: 2021-09-23

## 2021-07-10 NOTE — Progress Notes (Signed)
Telephone visit  Subjective: ZJ:IRCVELFYBO PCP: Raliegh Ip, DO FBP:ZWCHENIDPOE Todd Sandoval is Todd 35 y.o. male calls for telephone consult today. Patient provides verbal consent for consult held via phone.  Due to COVID-19 pandemic this visit was conducted virtually. This visit type was conducted due to national recommendations for restrictions regarding the COVID-19 Pandemic (e.g. social distancing, sheltering in place) in an effort to limit this patient's exposure and mitigate transmission in our community. All issues noted in this document were discussed and addressed.  Todd physical exam was not performed with this format.   Location of patient: home Location of provider: [facility] Others present for call: His children  1.  Depression Patient had GeneSight testing done recently and has received Todd copy of the results.  He has no questions about this but is eager to get started on Todd new medication.  He is currently taking 20 mg of the Celexa.  He has been gradually weaning from the max dose of 40 which she was on previously.  He has been on this medication for almost Todd year.  He is no longer treated with Wellbutrin but has not had any intolerances in the past from that medicine.  Continues to have depressive symptoms.  No SI or HI reported today.   ROS: Per HPI  No Known Allergies Past Medical History:  Diagnosis Date   Anxiety    Depression    Obesity     Current Outpatient Medications:    b complex vitamins tablet, Take 1 tablet by mouth daily., Disp: , Rfl:    buPROPion (WELLBUTRIN XL) 150 MG 24 hr tablet, Take 1 tablet (150 mg total) by mouth in the morning., Disp: 90 tablet, Rfl: 3   citalopram (CELEXA) 40 MG tablet, Take 1 tablet (40 mg total) by mouth daily., Disp: 90 tablet, Rfl: 3   fexofenadine (ALLEGRA) 180 MG tablet, Take 180 mg by mouth daily., Disp: , Rfl:   Assessment/ Plan: 35 y.o. male   Depression, recurrent (HCC) - Plan: citalopram (CELEXA) 10 MG tablet,  desvenlafaxine (PRISTIQ) 50 MG 24 hr tablet  GeneSight testing results were reviewed during today's visit.  These have been scanned into his chart.  We will discontinue the Celexa in 2 weeks after appropriate taper and he can start the Pristiq 50 mg.  We have an appointment scheduled for January 17 for his full physical we can address how he is feeling on the Pristiq at that time.  Discussed that we may need to reintroduce the Wellbutrin to support Pristiq if depression is not significantly controlled on the max dose of Pristiq.  He was amenable to this and will contact me should he have any concerns or questions that arise prior to that visit  Start time: 10:23am End time: 10:32am  Total time spent on patient care (including telephone call/ virtual visit): 9 minutes  Kameren Baade Hulen Skains, DO [facility] [phone]

## 2021-08-11 ENCOUNTER — Ambulatory Visit: Payer: Medicaid Other | Admitting: Family Medicine

## 2021-08-19 ENCOUNTER — Ambulatory Visit: Payer: Medicaid Other | Admitting: Family Medicine

## 2021-09-23 ENCOUNTER — Encounter: Payer: Self-pay | Admitting: Family Medicine

## 2021-09-23 ENCOUNTER — Ambulatory Visit (INDEPENDENT_AMBULATORY_CARE_PROVIDER_SITE_OTHER): Payer: 59 | Admitting: Family Medicine

## 2021-09-23 VITALS — BP 137/81 | HR 77 | Temp 98.0°F | Ht 67.0 in | Wt 333.4 lb

## 2021-09-23 DIAGNOSIS — L989 Disorder of the skin and subcutaneous tissue, unspecified: Secondary | ICD-10-CM | POA: Diagnosis not present

## 2021-09-23 DIAGNOSIS — Z0001 Encounter for general adult medical examination with abnormal findings: Secondary | ICD-10-CM | POA: Diagnosis not present

## 2021-09-23 DIAGNOSIS — S93491A Sprain of other ligament of right ankle, initial encounter: Secondary | ICD-10-CM

## 2021-09-23 DIAGNOSIS — R7303 Prediabetes: Secondary | ICD-10-CM | POA: Diagnosis not present

## 2021-09-23 DIAGNOSIS — R0989 Other specified symptoms and signs involving the circulatory and respiratory systems: Secondary | ICD-10-CM

## 2021-09-23 DIAGNOSIS — F339 Major depressive disorder, recurrent, unspecified: Secondary | ICD-10-CM

## 2021-09-23 DIAGNOSIS — F419 Anxiety disorder, unspecified: Secondary | ICD-10-CM | POA: Diagnosis not present

## 2021-09-23 DIAGNOSIS — Z0000 Encounter for general adult medical examination without abnormal findings: Secondary | ICD-10-CM

## 2021-09-23 MED ORDER — DESVENLAFAXINE SUCCINATE ER 50 MG PO TB24: 50.0000 mg | ORAL_TABLET | Freq: Every day | ORAL | 3 refills | Status: DC

## 2021-09-23 NOTE — Patient Instructions (Signed)
Let me know if the ankle continues to swell or be painful over the next couple of weeks.  If so, I will refer you to sports medicine for direct visualization under ultrasound.  In the interim, continue to use your ankle brace, supportive footwear and ice to the affected area.  Ankle Sprain An ankle sprain is a stretch or tear in one of the tough tissues (ligaments) that connect the bones in your ankle. An ankle sprain can happen when the ankle rolls outward (inversion sprain) or inward (eversion sprain). What are the causes? This condition is caused by rolling or twisting the ankle. What increases the risk? You are more likely to develop this condition if you play sports. What are the signs or symptoms? Symptoms of this condition include: Pain in your ankle. Swelling. Bruising. This may happen right after you sprain your ankle or 1-2 days later. Trouble standing or walking. How is this diagnosed? This condition is diagnosed with: A physical exam. During the exam, your doctor will press on certain parts of your foot and ankle and try to move them in certain ways. X-ray imaging. These may be taken to see how bad the sprain is and to check for broken bones. How is this treated? This condition may be treated with: A brace or splint. This is used to keep the ankle from moving until it heals. An elastic bandage. This is used to support the ankle. Crutches. Pain medicine. Surgery. This may be needed if the sprain is very bad. Physical therapy. This may help to improve movement in the ankle. Follow these instructions at home: If you have a brace or a splint: Wear the brace or splint as told by your doctor. Remove it only as told by your doctor. Loosen the brace or splint if your toes: Tingle. Lose feeling (become numb). Turn cold and blue. Keep the brace or splint clean. If the brace or splint is not waterproof: Do not let it get wet. Cover it with a watertight covering when you take a  bath or a shower. If you have an elastic bandage (dressing): Remove it to shower or bathe. Try not to move your ankle much, but wiggle your toes from time to time. This helps to prevent swelling. Adjust the dressing if it feels too tight. Loosen the dressing if your foot: Loses feeling. Tingles. Becomes cold and blue. Managing pain, stiffness, and swelling  Take over-the-counter and prescription medicines only as told by doctor. For 2-3 days, keep your ankle raised (elevated) above the level of your heart. If told, put ice on the injured area: If you have a removable brace or splint, remove it as told by your doctor. Put ice in a plastic bag. Place a towel between your skin and the bag. Leave the ice on for 20 minutes, 2-3 times a day. General instructions Rest your ankle. Do not use your injured leg to support your body weight until your doctor says that you can. Use crutches as told by your doctor. Do not use any products that contain nicotine or tobacco, such as cigarettes, e-cigarettes, and chewing tobacco. If you need help quitting, ask your doctor. Keep all follow-up visits as told by your doctor. Contact a doctor if: Your bruises or swelling are quickly getting worse. Your pain does not get better after you take medicine. Get help right away if: You cannot feel your toes or foot. Your foot or toes look blue. You have very bad pain that gets worse. Summary  An ankle sprain is a stretch or tear in one of the tough tissues (ligaments) that connect the bones in your ankle. This condition is caused by rolling or twisting the ankle. Symptoms include pain, swelling, bruising, and trouble walking. To help with pain and swelling, put ice on the injured ankle, raise your ankle above the level of your heart, and use an elastic bandage. Also, rest as told by your doctor. Keep all follow-up visits as told by your doctor. This is important. This information is not intended to replace  advice given to you by your health care provider. Make sure you discuss any questions you have with your health care provider. Document Revised: 09/12/2020 Document Reviewed: 09/12/2020 Elsevier Patient Education  2022 [employer].

## 2021-09-23 NOTE — Progress Notes (Signed)
Todd Sandoval is a 36 y.o. male presents to office today for annual physical exam examination.    Concerns today include: 1.  Right ankle pain Patient reports that he rolled his ankle on the right about a week ago.  He was only walking in stepped on the outside of his foot.  A few days ago he rolled it again after he stepped in a hole that his children dog in the backyard.  Both times he felt a pop and had subsequent swelling.  The initial time bruising occurred.  However did not recur with the second ankle rolling.  He has been utilizing a brace but he does have to work for several hours on his feet at [employer]  2.  Skin lesions Patient has some warty-like skin lesions on his neck and some pigmented skin lesions on his back that his wife was worried about and he would like to have a referral to have these further evaluated.  3.  Fluctuating blood pressure blood sugar His wife has been intermittently checking his blood pressure and blood sugar when he gets a headache.  When his blood sugar goes up, he is not sure how high, and she checks his blood pressure that seems to go down.  Not sure why.  Not on any medications for either.  Has been prediabetic in the past.  Technically is diagnosed with essential hypertension but is not medicated for this since BPs have been less than 140/90  Occupation: Works at [employer], Marital status: Married, Substance use: None Diet: Not restricted, Exercise: No structured Refills needed today: Pristiq Immunizations needed: Immunization History  Administered Date(s) Administered   DTaP 02/13/1988, 04/30/1988, 07/16/1988, 03/11/1989, 12/09/1992   Hepatitis B 04/25/1999, 05/29/1999, 10/23/1999   IPV 02/13/1988, 04/30/1988, 03/11/1989, 12/09/1992   Influenza-Unspecified 05/13/2010, 07/07/2011   MMR 03/11/1989, 12/09/1992   Meningococcal Conjugate 10/01/2005   Td 09/10/2005     Past Medical History:  Diagnosis Date   Anxiety     Depression    Obesity    Social History   Socioeconomic History   Marital status: Married    Spouse name: Todd Sandoval   Number of children: 4   Years of education: 12   Highest education level: Not on file  Occupational History   Occupation: unemployed    Employer: [employer]    Comment: works on [employer] shift  Tobacco Use   Smoking status: Never   Smokeless tobacco: Never  Vaping Use   Vaping Use: Never used  Substance and Sexual Activity   Alcohol use: No   Drug use: Not on file   Sexual activity: Yes  Other Topics Concern   Not on file  Social History Narrative   ** Merged History Encounter **       Lives with wife and four children Caffeine use: 30 oz per day Right handed   Social Determinants of Health   Financial Resource Strain: Not on file  Food Insecurity: Not on file  Transportation Needs: Not on file  Physical Activity: Not on file  Stress: Not on file  Social Connections: Not on file  Intimate Partner Violence: Not on file   Past Surgical History:  Procedure Laterality Date   VASECTOMY     Family History  Problem Relation Age of Onset   Hyperlipidemia Father    Early death Father    Heart disease Father    Skin cancer Father    Bipolar disorder Mother  Early death Paternal Grandfather    Heart disease Paternal Grandfather     Current Outpatient Medications:    b complex vitamins tablet, Take 1 tablet by mouth daily., Disp: , Rfl:    citalopram (CELEXA) 10 MG tablet, Take 1 tablet (10 mg total) by mouth daily. Then start Pristiq, Disp: 14 tablet, Rfl: 0   desvenlafaxine (PRISTIQ) 50 MG 24 hr tablet, Take 1 tablet (50 mg total) by mouth daily. Start after stopping the celexa in 2 weeks., Disp: 90 tablet, Rfl: 0   fexofenadine (ALLEGRA) 180 MG tablet, Take 180 mg by mouth daily., Disp: , Rfl:   No Known Allergies   ROS: Review of Systems Pertinent items noted in HPI and remainder of comprehensive ROS otherwise negative.     Physical exam BP 137/81    Pulse 77    Temp 98 F (36.7 C)    Ht _0  (1.702 m)    Wt (!) 333 lb 6.4 oz (151.2 kg)    SpO2 97%    BMI 52.22 kg/m  General appearance: alert, cooperative, appears stated age, and morbidly obese Head: Normocephalic, without obvious abnormality, atraumatic Eyes: negative findings: lids and lashes normal, conjunctivae and sclerae normal, corneas clear, and pupils equal, round, reactive to light and accomodation Ears: normal TM's and external ear canals both ears Nose: Nares normal. Septum midline. Mucosa normal. No drainage or sinus tenderness. Throat: lips, mucosa, and tongue normal; teeth and gums normal Neck: no adenopathy, no carotid bruit, supple, symmetrical, trachea midline, and thyroid not enlarged, symmetric, no tenderness/mass/nodules Back: symmetric, no curvature. ROM normal. No CVA tenderness. Lungs: clear to auscultation bilaterally Chest wall: no tenderness Heart: regular rate and rhythm, S1, S2 normal, no murmur, click, rub or gallop Abdomen:  obese Extremities:  Right ankle with mild soft tissue swelling.  Tenderness to palpation over the ATFL.  No puckering or deformity appreciated.  Mild pain with eversion of the ankle elicited Pulses: 2+ and symmetric Skin:  Multiple pigmented skin lesions on the neck and back.  Skin tags present Lymph nodes: Cervical, supraclavicular, and axillary nodes normal. Neurologic: Grossly normal Psych: Mood stable, speech normal, affect appropriate Depression screen [facility] 2/9 09/23/2021 06/19/2021 06/17/2021  Decreased Interest 0 2 2  Down, Depressed, Hopeless 0 2 3  PHQ - 2 Score 0 4 5  Altered sleeping 0 1 2  Tired, decreased energy _1 Change in appetite 0 1 2  Feeling bad or failure about yourself  0 2 2  Trouble concentrating 0 1 1  Moving slowly or fidgety/restless 0 0 1  Suicidal thoughts 0 1 1  PHQ-9 Score _2 Difficult doing work/chores Not difficult at all Very difficult Somewhat difficult   Some recent data might be hidden     Assessment/ Plan: Todd Sandoval here for annual physical exam.   Annual physical exam  Depression, recurrent ([place]) - Plan: TSH, desvenlafaxine (PRISTIQ) 50 MG 24 hr tablet  Anxiety - Plan: TSH  Morbid obesity ([place]) - Plan: Lipid panel, TSH, Bayer DCA Hb A1c Waived, CMP14+EGFR  Pre-diabetes - Plan: Bayer DCA Hb A1c Waived  Labile blood pressure - Plan: Lipid panel  Skin lesions - Plan: Ambulatory referral to Dermatology, CBC  Sprain of anterior talofibular ligament of right ankle, initial encounter  Up-to-date on preventative health care.  He will come in for labs at his earliest convenience.  Depression and anxiety are under good control with Pristiq.  Continue current regimen.  Renal has  been sent for 1 year  Lifestyle modification highly recommended to reduce weight and improve cardiovascular health.  He has been having some labile blood pressures.  Uncertain etiology of this.  He is not currently treated with any blood pressure medications.  I be interested know what his blood sugars are actually running since he has reported intermittent highs.  We will plan to check an A1c given known history of prediabetes.  If A1c demonstrates diabetes, would strongly consider something like a GLP that would help with both sugar and weight  For the skin lesions of concern, referral to dermatology has been placed per his request  I suspect a sprain of the right ankle.  He had pain with eversion of the right ankle.  There is no visible deformity or puckering to suggest total tear of the ATFL.  Ongoing ankle brace, supportive footwear and ice to the affected area recommended.  He will contact me if symptoms persist or worsen over the next couple of weeks, at which point low threshold for referral to sports medicine for direct visualization under ultrasound  Counseled on healthy lifestyle choices, including diet (rich in fruits, vegetables and lean  meats and low in salt and simple carbohydrates) and exercise (at least 30 minutes of moderate physical activity daily).  Patient to follow up in 1 year for annual exam or sooner if needed.  Ariza Evans M. Lajuana Ripple, DO

## 2021-09-26 ENCOUNTER — Other Ambulatory Visit: Payer: 59

## 2021-09-26 DIAGNOSIS — R0989 Other specified symptoms and signs involving the circulatory and respiratory systems: Secondary | ICD-10-CM

## 2021-09-26 DIAGNOSIS — F419 Anxiety disorder, unspecified: Secondary | ICD-10-CM | POA: Diagnosis not present

## 2021-09-26 DIAGNOSIS — L989 Disorder of the skin and subcutaneous tissue, unspecified: Secondary | ICD-10-CM | POA: Diagnosis not present

## 2021-09-26 DIAGNOSIS — F339 Major depressive disorder, recurrent, unspecified: Secondary | ICD-10-CM | POA: Diagnosis not present

## 2021-09-26 DIAGNOSIS — R7303 Prediabetes: Secondary | ICD-10-CM | POA: Diagnosis not present

## 2021-09-26 LAB — BAYER DCA HB A1C WAIVED: HB A1C (BAYER DCA - WAIVED): 5.7 % — ABNORMAL HIGH (ref 4.8–5.6)

## 2021-09-26 LAB — LIPID PANEL

## 2021-09-27 LAB — CMP14+EGFR
ALT: 99 IU/L — ABNORMAL HIGH (ref 0–44)
AST: 42 IU/L — ABNORMAL HIGH (ref 0–40)
Albumin/Globulin Ratio: 1.6 (ref 1.2–2.2)
Albumin: 4 g/dL (ref 4.0–5.0)
Alkaline Phosphatase: 70 IU/L (ref 44–121)
BUN/Creatinine Ratio: 14 (ref 9–20)
BUN: 12 mg/dL (ref 6–20)
Bilirubin Total: 0.8 mg/dL (ref 0.0–1.2)
CO2: 22 mmol/L (ref 20–29)
Calcium: 9.4 mg/dL (ref 8.7–10.2)
Chloride: 105 mmol/L (ref 96–106)
Creatinine, Ser: 0.84 mg/dL (ref 0.76–1.27)
Globulin, Total: 2.5 g/dL (ref 1.5–4.5)
Glucose: 108 mg/dL — ABNORMAL HIGH (ref 70–99)
Potassium: 4.3 mmol/L (ref 3.5–5.2)
Sodium: 140 mmol/L (ref 134–144)
Total Protein: 6.5 g/dL (ref 6.0–8.5)
eGFR: 118 mL/min/{1.73_m2} (ref >59)

## 2021-09-27 LAB — LIPID PANEL
Chol/HDL Ratio: 5.1 ratio — ABNORMAL HIGH (ref 0.0–5.0)
Cholesterol, Total: 184 mg/dL (ref 100–199)
HDL: 36 mg/dL — ABNORMAL LOW (ref >39)
LDL Chol Calc (NIH): 124 mg/dL — ABNORMAL HIGH (ref 0–99)
Triglycerides: 135 mg/dL (ref 0–149)
VLDL Cholesterol Cal: 24 mg/dL (ref 5–40)

## 2021-09-27 LAB — CBC
Hematocrit: 44.9 % (ref 37.5–51.0)
Hemoglobin: 15.6 g/dL (ref 13.0–17.7)
MCH: 30.3 pg (ref 26.6–33.0)
MCHC: 34.7 g/dL (ref 31.5–35.7)
MCV: 87 fL (ref 79–97)
Platelets: 274 10*3/uL (ref 150–450)
RBC: 5.15 x10E6/uL (ref 4.14–5.80)
RDW: 12.9 % (ref 11.6–15.4)
WBC: 5.7 10*3/uL (ref 3.4–10.8)

## 2021-09-27 LAB — TSH: TSH: 1.08 u[IU]/mL (ref 0.450–4.500)

## 2021-10-09 ENCOUNTER — Telehealth: Payer: Self-pay | Admitting: Family Medicine

## 2021-10-09 NOTE — Telephone Encounter (Signed)
Patient was referred to dermatologist and they do not take his insurance. Can he be referred to somewhere else? Please call back . ?

## 2021-10-13 ENCOUNTER — Telehealth: Payer: Self-pay | Admitting: Neurology

## 2021-10-13 NOTE — Telephone Encounter (Signed)
Pt's wife, Dyer Klug insurance company denied covering CPAP machine. Would like a call from the nurse. ?

## 2021-10-13 NOTE — Telephone Encounter (Signed)
Reviewed pt chart. He was last seen 01/09/2020. He was supposed to have initial cpap f/u 04/11/20 but appears he cx via automated system. Insurance most likely denied d/t not meeting compliance requirements (appt within 31-90 days after being set up/showing he has used at least 4 hr or more each night). He will need appt before we can proceed further since last visit in 2021.  ? ?DME companies do prior authorizations via insurance, our office does not. ?

## 2021-10-15 NOTE — Telephone Encounter (Signed)
Scheduled appt 12/03/21 at 2:30p ?

## 2021-12-03 ENCOUNTER — Encounter: Payer: 59 | Admitting: Neurology

## 2022-01-03 DIAGNOSIS — G4733 Obstructive sleep apnea (adult) (pediatric): Secondary | ICD-10-CM | POA: Diagnosis not present

## 2022-02-21 DIAGNOSIS — J039 Acute tonsillitis, unspecified: Secondary | ICD-10-CM | POA: Diagnosis not present

## 2022-03-09 ENCOUNTER — Encounter: Payer: Medicaid Other | Admitting: Adult Health

## 2022-05-13 DIAGNOSIS — M25572 Pain in left ankle and joints of left foot: Secondary | ICD-10-CM | POA: Diagnosis not present

## 2022-07-02 DIAGNOSIS — G4733 Obstructive sleep apnea (adult) (pediatric): Secondary | ICD-10-CM | POA: Diagnosis not present

## 2022-07-03 ENCOUNTER — Ambulatory Visit: Payer: Medicaid Other | Admitting: Family Medicine

## 2022-07-07 DIAGNOSIS — G4733 Obstructive sleep apnea (adult) (pediatric): Secondary | ICD-10-CM | POA: Diagnosis not present

## 2022-07-29 DIAGNOSIS — H5213 Myopia, bilateral: Secondary | ICD-10-CM | POA: Diagnosis not present

## 2022-07-31 DIAGNOSIS — J329 Chronic sinusitis, unspecified: Secondary | ICD-10-CM | POA: Diagnosis not present

## 2022-07-31 DIAGNOSIS — G4452 New daily persistent headache (NDPH): Secondary | ICD-10-CM | POA: Diagnosis not present

## 2022-07-31 DIAGNOSIS — J101 Influenza due to other identified influenza virus with other respiratory manifestations: Secondary | ICD-10-CM | POA: Diagnosis not present

## 2022-08-04 ENCOUNTER — Other Ambulatory Visit: Payer: Self-pay

## 2022-08-04 ENCOUNTER — Emergency Department (HOSPITAL_COMMUNITY)
Admission: EM | Admit: 2022-08-04 | Discharge: 2022-08-04 | Disposition: A | Discharge status: Home / Self Care | Payer: Medicaid Other | Attending: Emergency Medicine | Admitting: Emergency Medicine

## 2022-08-04 ENCOUNTER — Emergency Department (HOSPITAL_COMMUNITY): Payer: Medicaid Other

## 2022-08-04 ENCOUNTER — Telehealth: Payer: Self-pay | Admitting: Family Medicine

## 2022-08-04 ENCOUNTER — Encounter (HOSPITAL_COMMUNITY): Payer: Self-pay | Admitting: *Deleted

## 2022-08-04 DIAGNOSIS — J101 Influenza due to other identified influenza virus with other respiratory manifestations: Secondary | ICD-10-CM | POA: Insufficient documentation

## 2022-08-04 DIAGNOSIS — R519 Headache, unspecified: Secondary | ICD-10-CM | POA: Diagnosis not present

## 2022-08-04 DIAGNOSIS — Z1152 Encounter for screening for COVID-19: Secondary | ICD-10-CM | POA: Diagnosis not present

## 2022-08-04 LAB — RESP PANEL BY RT-PCR (RSV, FLU A&B, COVID)  RVPGX2
Influenza A by PCR: NEGATIVE
Influenza B by PCR: POSITIVE — AB
Resp Syncytial Virus by PCR: NEGATIVE
SARS Coronavirus 2 by RT PCR: NEGATIVE

## 2022-08-04 MED ORDER — METOCLOPRAMIDE HCL 5 MG/ML IJ SOLN
10.0000 mg | Freq: Once | INTRAMUSCULAR | Status: AC
  Administered 2022-08-04: 10 mg via INTRAMUSCULAR
  Filled 2022-08-04: qty 2

## 2022-08-04 MED ORDER — DIPHENHYDRAMINE HCL 25 MG PO CAPS
25.0000 mg | ORAL_CAPSULE | Freq: Once | ORAL | Status: AC
  Administered 2022-08-04: 25 mg via ORAL
  Filled 2022-08-04: qty 1

## 2022-08-04 MED ORDER — KETOROLAC TROMETHAMINE 60 MG/2ML IM SOLN
60.0000 mg | Freq: Once | INTRAMUSCULAR | Status: AC
  Administered 2022-08-04: 60 mg via INTRAMUSCULAR
  Filled 2022-08-04: qty 2

## 2022-08-04 MED ORDER — IBUPROFEN 800 MG PO TABS
800.0000 mg | ORAL_TABLET | Freq: Once | ORAL | Status: AC
  Administered 2022-08-04: 800 mg via ORAL
  Filled 2022-08-04: qty 1

## 2022-08-04 MED ORDER — SODIUM CHLORIDE 0.9 % IV BOLUS
1000.0000 mL | Freq: Once | INTRAVENOUS | Status: AC
  Administered 2022-08-04: 1000 mL via INTRAVENOUS

## 2022-08-04 NOTE — ED Provider Triage Note (Signed)
Emergency Medicine Provider Triage Evaluation Note  Todd Sandoval , a 37 y.o. male  was evaluated in triage.  Pt complains of generalized bodyaches, congestion, cough symptoms present since 07/28/2022 he was seen at [facility] urgent care on 1229 started on prednisone, azithromycin, and promethazine DM cough syrup.  Has been taking medication as prescribed.  Began having frontal, left-sided throbbing headache yesterday.  Has had 2 episodes of vomiting since onset.  Describes headache is throbbing in quality, no history of prior headaches or migraines.  He denies any neck pain or stiffness, no fever.  States he felt like he was getting better from the flu symptoms until the headache began.  He denies any chest pain, shortness of breath abdominal pain or diarrhea..  Review of Systems  Positive: Generalized bodyaches, left-sided frontal headache Negative: , Neck pain, dizziness, fever  Physical Exam  BP 120/77 (BP Location: Right Arm)   Pulse (!) 56   Temp 97.8 F (36.6 C) (Oral)   Resp 20   SpO2 94%  Gen:   Awake, no distress   Resp:  Normal effort  MSK:   Moves extremities without difficulty  Other:  No nuchal rigidity  Medical Decision Making  Medically screening exam initiated at 6:40 PM.  Appropriate orders placed.  Todd Sandoval was informed that the remainder of the evaluation will be completed by another provider, this initial triage assessment does not replace that evaluation, and the importance of remaining in the ED until their evaluation is complete.     Kem Parkinson, PA-C 08/04/22 1845

## 2022-08-04 NOTE — ED Triage Notes (Signed)
Pt has been sick with flu like symptoms since 12/26.  He was seen at [facility] on 12/29 and placed on prednisone and azithromycin and promethazine.  Pt began having HA on 1/1.  Pt reports HA 8/10 and has had vomiting x2.  No fever, neck supple

## 2022-08-04 NOTE — Discharge Instructions (Signed)
Rest make sure you are drinking plenty of fluids.  I recommend continuing Tylenol or ibuprofen for any fevers and for headache pain relief.

## 2022-08-04 NOTE — Telephone Encounter (Signed)
Agree with recs.

## 2022-08-04 NOTE — Telephone Encounter (Signed)
noted 

## 2022-08-04 NOTE — Telephone Encounter (Signed)
I spoke to pt's spouse who states pt is having a headache and thought it was related to prednisone but he hasn't taken any prednisone in over 36 hours. Pt isn't drinking a lot of fluids as he states he may have had 40 oz yesterday and now when he sits up he is now getting a little dizzy and light headed. Pt declines any visual disturbances or chest pains or SOB. Advised he should hydrate with at least 64 oz of water daily and use otc tylenol for headache and monitor his symptoms as he may a little dehydrated and if he starts to develop chest pain, SOB or visual disturbances he should be seen at ED or UC and pt voiced understanding. He does have an appt with you this Friday.   Is there anything else?

## 2022-08-04 NOTE — ED Provider Notes (Signed)
[facility] EMERGENCY DEPARTMENT Provider Note   CSN: [id number] Arrival date & time: 08/04/22  1422     History  Chief Complaint  Patient presents with   Illness    Todd Sandoval is a 37 y.o. male presenting with a 1 week history of flulike symptoms, was diagnosed with influenza 4 days ago and placed on prednisone, Phenergan and a Z-Pak on that day.  He was feeling slightly improved when he had sudden onset of left-sided headache yesterday morning when he was trying to cook breakfast.  It was not accompanied by focal weakness, vision changes, neck pain or stiffness but was associated with nausea and emesis x 2.  He has taken Tylenol and Motrin yesterday but got no relief from the headache.  His headache persist today.  He denies shortness of breath, cough, he did have some nasal congestion with clear rhinorrhea which is improving, denies sore throat, abdominal pain, no current vomiting. He has had generalized weakness and intermittent positional lightheadedness when he first stands. Poor PO intake since yesterday.    The history is provided by the patient.       Home Medications Prior to Admission medications   Medication Sig Start Date End Date Taking? Authorizing Provider  b complex vitamins tablet Take 1 tablet by mouth daily.    [provider]  desvenlafaxine (PRISTIQ) 50 MG 24 hr tablet Take 1 tablet (50 mg total) by mouth daily. 09/23/21   Raliegh Ip, DO  fexofenadine (ALLEGRA) 180 MG tablet Take 180 mg by mouth daily.    [provider]      Allergies    Patient has no known allergies.    Review of Systems   Review of Systems  Constitutional:  Negative for chills and fever.  HENT:  Negative for congestion and sore throat.   Eyes: Negative.  Negative for photophobia and visual disturbance.  Respiratory:  Negative for chest tightness and shortness of breath.   Cardiovascular:  Negative for chest pain.  Gastrointestinal:  Positive for  nausea and vomiting. Negative for abdominal pain.  Genitourinary: Negative.   Musculoskeletal:  Negative for arthralgias, joint swelling, neck pain and neck stiffness.  Skin: Negative.  Negative for rash and wound.  Neurological:  Positive for weakness and headaches. Negative for dizziness, light-headedness and numbness.  Psychiatric/Behavioral: Negative.    All other systems reviewed and are negative.   Physical Exam Updated Vital Signs BP 122/65   Pulse 63   Temp 97.8 F (36.6 C) (Oral)   Resp 18   SpO2 97%  Physical Exam Vitals and nursing note reviewed.  Constitutional:      Appearance: He is well-developed.  HENT:     Head: Normocephalic and atraumatic.     Right Ear: Tympanic membrane normal.     Left Ear: Tympanic membrane normal.  Eyes:     Extraocular Movements: Extraocular movements intact.     Conjunctiva/sclera: Conjunctivae normal.     Pupils: Pupils are equal, round, and reactive to light.  Cardiovascular:     Rate and Rhythm: Normal rate and regular rhythm.     Heart sounds: Normal heart sounds.  Pulmonary:     Effort: Pulmonary effort is normal.     Breath sounds: Normal breath sounds. No wheezing.  Abdominal:     General: Bowel sounds are normal.     Palpations: Abdomen is soft.     Tenderness: There is no abdominal tenderness.  Musculoskeletal:  General: Normal range of motion.     Cervical back: Normal range of motion and neck supple.  Lymphadenopathy:     Cervical: No cervical adenopathy.  Skin:    General: Skin is warm and dry.     Findings: No rash.  Neurological:     General: No focal deficit present.     Mental Status: He is alert and oriented to person, place, and time.     GCS: GCS eye subscore is 4. GCS verbal subscore is 5. GCS motor subscore is 6.     Cranial Nerves: No cranial nerve deficit.     Sensory: No sensory deficit.     Coordination: Coordination normal.     Gait: Gait normal.     Deep Tendon Reflexes: Reflexes normal.      Comments: Normal heel-shin, normal rapid alternating movements. Cranial nerves III-XII intact.  No pronator drift.  Psychiatric:        Speech: Speech normal.        Behavior: Behavior normal.        Thought Content: Thought content normal.     ED Results / Procedures / Treatments   Labs (all labs ordered are listed, but only abnormal results are displayed) Labs Reviewed  RESP PANEL BY RT-PCR (RSV, FLU A&B, COVID)  RVPGX2 - Abnormal; Notable for the following components:      Result Value   Influenza B by PCR POSITIVE (*)    All other components within normal limits    EKG None  Radiology CT Head Wo Contrast  Result Date: 08/04/2022 CLINICAL DATA:  Sudden onset severe headache EXAM: CT HEAD WITHOUT CONTRAST TECHNIQUE: Contiguous axial images were obtained from the base of the skull through the vertex without intravenous contrast. RADIATION DOSE REDUCTION: This exam was performed according to the departmental dose-optimization program which includes automated exposure control, adjustment of the mA and/or kV according to patient size and/or use of iterative reconstruction technique. COMPARISON:  None Available. FINDINGS: Brain: There is no mass, hemorrhage or extra-axial collection. The size and configuration of the ventricles and extra-axial CSF spaces are normal. The brain parenchyma is normal, without acute or chronic infarction. Vascular: No abnormal hyperdensity of the major intracranial arteries or dural venous sinuses. No intracranial atherosclerosis. Skull: The visualized skull base, calvarium and extracranial soft tissues are normal. Sinuses/Orbits: Maxillary sinus mucosal thickening. The orbits are normal. IMPRESSION: Normal head CT. Electronically Signed   By: Deatra Robinson M.D.   On: 08/04/2022 20:56    Procedures Procedures    Medications Ordered in ED Medications  ibuprofen (ADVIL) tablet 800 mg (800 mg Oral Given 08/04/22 1514)  ketorolac (TORADOL) injection 60 mg (60  mg Intramuscular Given 08/04/22 1939)  diphenhydrAMINE (BENADRYL) capsule 25 mg (25 mg Oral Given 08/04/22 1939)  metoCLOPramide (REGLAN) injection 10 mg (10 mg Intramuscular Given 08/04/22 1939)  sodium chloride 0.9 % bolus 1,000 mL (0 mLs Intravenous Stopped 08/04/22 2159)    ED Course/ Medical Decision Making/ A&P                           Medical Decision Making Pt with influenza and headache, n/v, poor PO intake x 2 days.  VSS, no sob, pulmonary exam reassuring, neuro exam without deficits, no nuchal rigidity suggesting meningitis, no photophobia.  Ct imaging completed given sudden onset of headache sx, negative for bleed.  He was given IV fluids given poor po intake and n/v, he was able to  tolerate PO intake prior to dc,  headache improving with meds including ibuprofen, IV toradol, benadryl, reglan.  Stable at time of dc.   Amount and/or Complexity of Data Reviewed Labs: ordered.    Details: Respiratory panel Influenza B positive Radiology: ordered.    Details: Ct head, no acute findings.  Risk OTC drugs.           Final Clinical Impression(s) / ED Diagnoses Final diagnoses:  Influenza B  Acute nonintractable headache, unspecified headache type    Rx / DC Orders ED Discharge Orders     None         Victoriano Lain 08/05/22 2342    Terrilee Files, MD 08/06/22 1101

## 2022-08-04 NOTE — Telephone Encounter (Signed)
Wife called stating that pt recently tested positive for the flu and then this past week/weekend he starting complaining about side/back pain so he went to Urgent Care to make sure it wasn't pneumonia. Says pt was prescribed a zpak and prednisone. Now says since yesterday pt has developed a very bad headache on front left side of head and has to stay laying down to tolerate it because everytime he gets up, the headache gets worse. Says pt did stop taking the prednisone in case it was a reaction from medicine he was getting. Needs advise from Dr Lajuana Ripple if pt should be concerned.

## 2022-08-06 ENCOUNTER — Encounter: Payer: Medicaid Other | Admitting: Adult Health

## 2022-08-07 ENCOUNTER — Encounter: Payer: Self-pay | Admitting: Family Medicine

## 2022-08-07 ENCOUNTER — Ambulatory Visit: Payer: Medicaid Other | Admitting: Family Medicine

## 2022-08-07 ENCOUNTER — Ambulatory Visit (INDEPENDENT_AMBULATORY_CARE_PROVIDER_SITE_OTHER): Payer: Medicaid Other

## 2022-08-07 VITALS — BP 151/90 | HR 75 | Temp 97.9°F | Ht 67.0 in | Wt 311.8 lb

## 2022-08-07 DIAGNOSIS — M7989 Other specified soft tissue disorders: Secondary | ICD-10-CM | POA: Diagnosis not present

## 2022-08-07 DIAGNOSIS — G8929 Other chronic pain: Secondary | ICD-10-CM

## 2022-08-07 DIAGNOSIS — M25572 Pain in left ankle and joints of left foot: Secondary | ICD-10-CM

## 2022-08-07 DIAGNOSIS — I10 Essential (primary) hypertension: Secondary | ICD-10-CM | POA: Diagnosis not present

## 2022-08-07 MED ORDER — AMLODIPINE BESYLATE 2.5 MG PO TABS: 2.5000 mg | ORAL_TABLET | Freq: Every day | ORAL | 3 refills | Status: DC

## 2022-08-07 MED ORDER — MELOXICAM 7.5 MG PO TABS: 7.5000 mg | ORAL_TABLET | Freq: Every day | ORAL | 0 refills | Status: DC | PRN

## 2022-08-07 NOTE — Progress Notes (Signed)
Subjective: [id number]:XQJJ ankle pain PCP: Janora Norlander, DO HER:DEYCXKGYJEH Todd Sandoval is Todd 37 y.o. male presenting to clinic today for:  1. Chronic left ankle pain Has seen orthopedics.  Recommended MRI ankle but apparently has to wait 74m of conservative therapy before they will cover.  Using Tylenol and an ankle brace as needed.  Climbing and walking on concrete floors makes pain worse. Points to the medial left ankle.  Reports swelling.  Icing ankle every other day.  2. Elevated BP without dx of HTN BP elevated in ER recently as well.  No CP, SOB, LE edema, headache or visual disturbance.  Does not follow Todd diet nor is he active outside of work (works in [employer] in [place] now).   ROS: Per HPI  No Known Allergies Past Medical History:  Diagnosis Date   Anxiety    Depression    Obesity     Current Outpatient Medications:    b complex vitamins tablet, Take 1 tablet by mouth daily., Disp: , Rfl:    desvenlafaxine (PRISTIQ) 50 MG 24 hr tablet, Take 1 tablet (50 mg total) by mouth daily., Disp: 90 tablet, Rfl: 3   fexofenadine (ALLEGRA) 180 MG tablet, Take 180 mg by mouth daily., Disp: , Rfl:  Social History   Socioeconomic History   Marital status: Married    Spouse name: Colletta [place]   Number of children: 4   Years of education: 12   Highest education level: Not on file  Occupational History   Occupation: unemployed    Employer: [employer]    Comment: works on [employer] shift  Tobacco Use   Smoking status: Never   Smokeless tobacco: Never  Vaping Use   Vaping Use: Never used  Substance and Sexual Activity   Alcohol use: No   Drug use: Not on file   Sexual activity: Yes  Other Topics Concern   Not on file  Social History Narrative   ** Merged History Encounter **       Lives with wife and four children Caffeine use: 30 oz per day Right handed   Social Determinants of Health   Financial Resource Strain: Not on file  Food Insecurity: Not on file   Transportation Needs: Not on file  Physical Activity: Not on file  Stress: Not on file  Social Connections: Not on file  Intimate Partner Violence: Not on file   Family History  Problem Relation Age of Onset   Hyperlipidemia Father    Early death Father    Heart disease Father    Skin cancer Father    Bipolar disorder Mother    Early death Paternal Grandfather    Heart disease Paternal Grandfather     Objective: Office vital signs reviewed. Pulse 75   Temp 97.9 F (36.6 C)   Ht 5\' 7"  (1.702 m)   Wt (!) 311 lb 12.8 oz (141.4 kg)   SpO2 98%   BMI 48.83 kg/m   Physical Examination:  General: Awake, alert, morbidly obese, No acute distress HEENT: sclera white, MMM  Cardio: regular rate and rhythm  Pulm:   normal work of breathing on room air MSK: minimal soft tissue swelling along medial left ankle. No palpable or visible defects.  No erythema or warmth. Pt is ambulating independently with normal gait  Assessment/ Plan: 37 y.o. male   Chronic pain of left ankle - Plan: DG Ankle Complete Left, meloxicam (MOBIC) 7.5 MG tablet  Essential hypertension - Plan: amLODipine (NORVASC) 2.5 MG  tablet  Reviewed plain film. No significant change from xrays from ortho.  Mobic 7.5mg  qd prn added.  Use with caution given SNRI.  Continue brace/ ice.  Norvasc 2.5mg  daily added for BP.  Follow up in 3 months for PE with fasting labs/ repeat BP, etc.  Orders Placed This Encounter  Procedures   DG Ankle Complete Left    Order Specific Question:   Reason for Exam (SYMPTOM  OR DIAGNOSIS REQUIRED)    Answer:   ROLLED ANKLE    Order Specific Question:   Preferred imaging location?    Answer:   Internal   No orders of the defined types were placed in this encounter.    Janora Norlander, DO [place] [phone]

## 2022-08-24 ENCOUNTER — Encounter: Payer: Self-pay | Admitting: Family Medicine

## 2022-08-24 ENCOUNTER — Telehealth: Payer: Self-pay | Admitting: Family Medicine

## 2022-08-24 NOTE — Telephone Encounter (Signed)
Patient's wife calling because patient is still having headaches daily on the left side, bp on Sunday 1/21 was 136/86 while taking bp meds that PCP prescribed. Wife wants to know if patient should be referred for a CT scan. Please call back and advise.

## 2022-08-24 NOTE — Telephone Encounter (Signed)
Called and left vm for wife - asked if pt could come in 27mmrw

## 2022-08-24 NOTE — Telephone Encounter (Signed)
He denied headache when I saw him.  If he has been having them/ getting worse or accompanied by any neurologic changes, I want to see him.  See if he wants to come in for DOD tomorrow

## 2022-08-25 ENCOUNTER — Ambulatory Visit (INDEPENDENT_AMBULATORY_CARE_PROVIDER_SITE_OTHER): Payer: Medicaid Other | Admitting: Family Medicine

## 2022-08-25 ENCOUNTER — Encounter: Payer: Self-pay | Admitting: Family Medicine

## 2022-08-25 VITALS — BP 147/83 | HR 81 | Temp 98.7°F | Ht 67.0 in | Wt 317.0 lb

## 2022-08-25 DIAGNOSIS — G4733 Obstructive sleep apnea (adult) (pediatric): Secondary | ICD-10-CM

## 2022-08-25 DIAGNOSIS — I10 Essential (primary) hypertension: Secondary | ICD-10-CM

## 2022-08-25 DIAGNOSIS — G4452 New daily persistent headache (NDPH): Secondary | ICD-10-CM

## 2022-08-25 MED ORDER — PROPRANOLOL HCL 10 MG PO TABS: 10.0000 mg | ORAL_TABLET | Freq: Two times a day (BID) | ORAL | 1 refills | Status: DC

## 2022-08-25 MED ORDER — SUMATRIPTAN SUCCINATE 50 MG PO TABS: 50.0000 mg | ORAL_TABLET | ORAL | 0 refills | Status: DC | PRN

## 2022-08-25 NOTE — Patient Instructions (Signed)
Form - Headache Record There are many types and causes of headaches. A headache record can help guide your treatment plan. Use this form to record the details. Bring this form with you to your follow-up visits. Follow your health care provider's instructions on how to describe your headache. You may be asked to: Use a pain scale. This is a tool to rate the intensity of your headache using words or numbers. Describe what your headache feels like, such as dull, achy, throbbing, or sharp. Headache record Date: _______________ Time (from start to end): ____________________ Location of the headache: _________________________ Intensity of the headache: ____________________ Description of the headache: ______________________________________________________________ Hours of sleep the night before the headache: __________ Food or drinks before the headache started: ______________________________________________________________________________________ Events before the headache started: _______________________________________________________________________________________________ Symptoms before the headache started: __________________________________________________________________________________________ Symptoms during the headache: __________________________________________________________________________________________________ Treatment: ________________________________________________________________________________________________________________ Effect of treatment: _________________________________________________________________________________________________________ Other comments: ___________________________________________________________________________________________________________ Date: _______________ Time (from start to end): ____________________ Location of the headache: _________________________ Intensity of the headache: ____________________ Description of the headache:  ______________________________________________________________ Hours of sleep the night before the headache: __________ Food or drinks before the headache started: ______________________________________________________________________________________ Events before the headache started: ____________________________________________________________________________________________ Symptoms before the headache started: _________________________________________________________________________________________ Symptoms during the headache: _______________________________________________________________________________________________ Treatment: ________________________________________________________________________________________________________________ Effect of treatment: _________________________________________________________________________________________________________ Other comments: ___________________________________________________________________________________________________________ Date: _______________ Time (from start to end): ____________________ Location of the headache: _________________________ Intensity of the headache: ____________________ Description of the headache: ______________________________________________________________ Hours of sleep the night before the headache: __________ Food or drinks before the headache started: ______________________________________________________________________________________ Events before the headache started: ____________________________________________________________________________________________ Symptoms before the headache started: _________________________________________________________________________________________ Symptoms during the headache: _______________________________________________________________________________________________ Treatment:  ________________________________________________________________________________________________________________ Effect of treatment: _________________________________________________________________________________________________________ Other comments: ___________________________________________________________________________________________________________ Date: _______________ Time (from start to end): ____________________ Location of the headache: _________________________ Intensity of the headache: ____________________ Description of the headache: ______________________________________________________________ Hours of sleep the night before the headache: _________ Food or drinks before the headache started: ______________________________________________________________________________________ Events before the headache started: ____________________________________________________________________________________________ Symptoms before the headache started: _________________________________________________________________________________________ Symptoms during the headache: _______________________________________________________________________________________________ Treatment: ________________________________________________________________________________________________________________ Effect of treatment: _________________________________________________________________________________________________________ Other comments: ___________________________________________________________________________________________________________ Date: _______________ Time (from start to end): ____________________ Location of the headache: _________________________ Intensity of the headache: ____________________ Description of the headache: ______________________________________________________________ Hours of sleep the night before the headache: _________ Food or drinks before the headache started:  ______________________________________________________________________________________ Events before the headache started: ____________________________________________________________________________________________ Symptoms before the headache started: _________________________________________________________________________________________ Symptoms during the headache: _______________________________________________________________________________________________ Treatment: ________________________________________________________________________________________________________________ Effect of treatment: _________________________________________________________________________________________________________ Other comments: ___________________________________________________________________________________________________________ This information is not intended to replace advice given to you by your health care provider. Make sure you discuss any questions you have with your health care provider. Document Revised: 12/18/2020 Document Reviewed: 12/18/2020 Elsevier Patient Education  2023 [employer]  

## 2022-08-25 NOTE — Progress Notes (Signed)
I have separately seen and examined the patient. I have discussed the findings and exam with student Dr Mariel Kansky and agree with the below note.  My changes/additions are outlined in BLUE.    S: Patient presents today with ongoing headaches.  His wife has not really been aggressive at getting him to change his lifestyle.  They have eliminated a lot of salt and clean up diet quite a bit since her last visit.  He continues to suffer with transient headaches that were more predominant during flu but continue to happen on now almost a daily basis.  The headaches continue usually about 30 minutes before improving.  Often he has to take Motrin up to twice daily.  Loud sounds, particularly bending at work seem to exacerbate these headaches.  He does not report any blurred vision, double vision, loss of vision, unilateral weakness, sensory changes, difficulty with speech, difficulty with swallowing or balance issues.  He just got new glasses and they seem to be working well.  He is compliant with his CPAP for OSA.  He is hydrating well with mostly water and an occasional half diet Dr. Reino Kent.  He has been compliant with his antihypertensive and notes that blood pressure typically is under control at home.  No known family history of migraine headaches.  Denies excessive caffeine intake even prior to lifestyle modifications.  O: Vitals:   08/25/22 1543 08/25/22 1627  BP: (!) 149/96 (!) 147/83  Pulse: 81   Temp: 98.7 F (37.1 C)   SpO2: 97%     Gen: obese, nontoxic appearing male, NAD HEENT: sclera white, MMM, PERRL, EOMI, TMs in tact bilaterally with no erythema or bulging.  Scant cerumen in external auditory canals. Neuro: CN 2-12 grossly in tact, normal lower extremity cerebellar testing. 5/5 UE and LE strength with light touch sensation grossly in tact. AO, no focal neurologic deficits.  A/P:  New persistent daily headache - Plan: propranolol (INDERAL) 10 MG tablet, SUMAtriptan (IMITREX) 50 MG  tablet  OSA on CPAP  Morbid obesity (HCC)  Essential hypertension - Plan: propranolol (INDERAL) 10 MG tablet  Some features of migraine headache given reports of photophobia and phonophobia but not lasting very long.  I considered that his obstructive sleep apnea might be contributing but he is compliant with CPAP so I think this to be less likely.  His neurologic exam was totally unremarkable.  We discussed that a brain lesion would be very unusual given a normal neurologic exam but if he had no significant improvement with medications prescribed today that would have a low threshold for obtaining an MRI of the brain as this would be more sensitive to finding intracranial lesions.  I agree with ongoing hydration, adequate sleep, stress control.  Blood pressure was not at goal today so hopefully this low-dose propranolol will also contribute to better control of hypertension.  I would like to reassess closely in 4 weeks, sooner if concerns arise or any neurologic changes occur.  Elizette Shek M. Nadine Counts, DO [facility]   -------------------------------------------------------------------------------------------------------------------------------------------------------------------------------------     Subjective: CC: headache PCP: Raliegh Ip, DO OJJ:[id number] A Ordway is a 37 y.o. male presenting to clinic today for:  Headache Patient reports that his headaches started on January first. He initially thought that the headache was secondary to influenza. However, the headache has not remitted and has worsened in its severity over time. He reports that the headache has a baseline level of a 3/10 and worsens to a 9/10 pain at  least twice per day. He characterizes the pain as dull, shooting pain. He reports that ibuprofen does alleviate the pain somewhat, however, he has been limiting the amount of ibuprofen he takes to 800 mg per day. When the headache comes on,  he lies in a dark room until the exacerbation passes. The pain does not radiate; however, he has noticed some neck pain on one side. No rashes. The headache is not preceded by aura; however, he notes that he does notice photophobia and phonophobia. He reports that sometimes loud noises precipitate an acute worsening of the headaches. No recent head trauma, changes to vision, significant caffeine, stimulant, or alcohol intake.  ROS: Per HPI  No Known Allergies Past Medical History:  Diagnosis Date   Anxiety    Depression    Obesity     Current Outpatient Medications:    amLODipine (NORVASC) 2.5 MG tablet, Take 1 tablet (2.5 mg total) by mouth daily., Disp: 90 tablet, Rfl: 3   desvenlafaxine (PRISTIQ) 50 MG 24 hr tablet, Take 1 tablet (50 mg total) by mouth daily., Disp: 90 tablet, Rfl: 3   fexofenadine (ALLEGRA) 180 MG tablet, Take 180 mg by mouth daily., Disp: , Rfl:    meloxicam (MOBIC) 7.5 MG tablet, Take 1 tablet (7.5 mg total) by mouth daily as needed for pain., Disp: 90 tablet, Rfl: 0   propranolol (INDERAL) 10 MG tablet, Take 1 tablet (10 mg total) by mouth 2 (two) times daily., Disp: 60 tablet, Rfl: 1   SUMAtriptan (IMITREX) 50 MG tablet, Take 1 tablet (50 mg total) by mouth every 2 (two) hours as needed for migraine. May repeat in 2 hours if headache persists or recurs., Disp: 10 tablet, Rfl: 0   b complex vitamins tablet, Take 1 tablet by mouth daily. (Patient not taking: Reported on 08/25/2022), Disp: , Rfl:  Social History   Socioeconomic History   Marital status: Married    Spouse name: Colletta [place]   Number of children: 4   Years of education: 12   Highest education level: Not on file  Occupational History   Occupation: unemployed    Employer: [employer]    Comment: works on [employer] shift  Tobacco Use   Smoking status: Never   Smokeless tobacco: Never  Vaping Use   Vaping Use: Never used  Substance and Sexual Activity   Alcohol use: No   Drug use: Not on  file   Sexual activity: Yes  Other Topics Concern   Not on file  Social History Narrative   ** Merged History Encounter **       Lives with wife and four children Caffeine use: 30 oz per day Right handed   Social Determinants of Health   Financial Resource Strain: Not on file  Food Insecurity: Not on file  Transportation Needs: Not on file  Physical Activity: Not on file  Stress: Not on file  Social Connections: Not on file  Intimate Partner Violence: Not on file   Family History  Problem Relation Age of Onset   Hyperlipidemia Father    Early death Father    Heart disease Father    Skin cancer Father    Bipolar disorder Mother    Early death Paternal Grandfather    Heart disease Paternal Grandfather     Objective: Office vital signs reviewed. BP (!) 147/83   Pulse 81   Temp 98.7 F (37.1 C)   Ht 5\' 7"  (1.702 m)   Wt (!) 317 lb (143.8 kg)  SpO2 97%   BMI 49.65 kg/m    General: Awake, alert, well nourished, No acute distress HEENT: Normal    Neck: No masses palpated. No lymphadenopathy    Ears: Tympanic membranes intact, normal light reflex, no erythema, no bulging. Dark brown cerumen present bilaterally.    Eyes: PERRLA, extraocular membranes intact, sclera white    Nose: nasal turbinates moist, no nasal discharge     Throat: moist mucus membranes, no erythema, no tonsillar exudate.  Airway is patent Cardio: regular rate and rhythm, S1S2 heard, no murmurs appreciated Pulm: clear to auscultation bilaterally, no wheezes, rhonchi or rales; normal work of breathing on room air GI: soft, non-tender, non-distended, bowel sounds present x4, no hepatomegaly, no splenomegaly, no masses Extremities: warm, well perfused, No edema, cyanosis or clubbing; +1 pulses bilaterally Skin: dry; intact; no rashes or lesions Neurological:     General: No focal deficit present.     Mental Status: He is alert and oriented to person, place, and time. Mental status is at baseline.      Cranial Nerves: Cranial nerves 2-12 are intact.     Sensory: Sensation is intact.     Motor: Motor function is intact. No weakness or tremor.     Coordination: Coordination is intact.     Gait: Gait is intact.   Assessment/ Plan: 37 y.o. male   Given that headaches are unilateral, present with some photophobia and phonophobia, most likely diagnosis at this time migraine headache. Given no recent trauma, as well as no neurologic deficits noted on exam, imaging is not warranted at this time. Not concerned for increased intracranial pressure given no nausea. Less likely NSAID overuse headache given that patient is taking under 800 mg per day. No periorbital pain suggesting this is not a cluster headache. Less likely tension headache given that pain is unilateral. Patient continues to be hypertensive. Given hypertension and likely migraine headache, provided prescription for propranolol 10 mg as well as sumatriptan 50 mg for abortive treatment. Advised patient to initiate a headache diary and follow-up in one month. Patient expressed understanding of the plan.   No orders of the defined types were placed in this encounter.  Meds ordered this encounter  Medications   propranolol (INDERAL) 10 MG tablet    Sig: Take 1 tablet (10 mg total) by mouth 2 (two) times daily.    Dispense:  60 tablet    Refill:  1   SUMAtriptan (IMITREX) 50 MG tablet    Sig: Take 1 tablet (50 mg total) by mouth every 2 (two) hours as needed for migraine. May repeat in 2 hours if headache persists or recurs.    Dispense:  10 tablet    Refill:  0    Stephani Police, MS3

## 2022-09-08 NOTE — Telephone Encounter (Signed)
Patients headaches have subsided. Will come in for Mar 1st appt. Wlll call if things worsen.

## 2022-10-02 ENCOUNTER — Encounter: Payer: Self-pay | Admitting: Family Medicine

## 2022-10-02 ENCOUNTER — Ambulatory Visit: Payer: Managed Care, Other (non HMO) | Admitting: Family Medicine

## 2022-10-02 VITALS — BP 138/84 | HR 98 | Temp 98.4°F | Ht 67.0 in | Wt 316.0 lb

## 2022-10-02 DIAGNOSIS — G4489 Other headache syndrome: Secondary | ICD-10-CM | POA: Diagnosis not present

## 2022-10-02 DIAGNOSIS — L739 Follicular disorder, unspecified: Secondary | ICD-10-CM | POA: Diagnosis not present

## 2022-10-02 DIAGNOSIS — G8929 Other chronic pain: Secondary | ICD-10-CM

## 2022-10-02 DIAGNOSIS — M25572 Pain in left ankle and joints of left foot: Secondary | ICD-10-CM | POA: Diagnosis not present

## 2022-10-02 DIAGNOSIS — I10 Essential (primary) hypertension: Secondary | ICD-10-CM | POA: Diagnosis not present

## 2022-10-02 DIAGNOSIS — G4733 Obstructive sleep apnea (adult) (pediatric): Secondary | ICD-10-CM | POA: Diagnosis not present

## 2022-10-02 MED ORDER — SULFACETAMIDE SOD-SULFUR WASH 9-4.5 % EX LIQD: CUTANEOUS | 99 refills | Status: DC

## 2022-10-02 NOTE — Progress Notes (Signed)
Subjective: [id number] follow up PCP: Janora Norlander, DO [id number] Todd Sandoval is Todd 37 y.o. male presenting to clinic today for:  1. Headaches Started on propranolol for migraine prevention. Imitrex provided for prn use.  He uses about 2 weeks if his headaches totally resolved and he had no recurrence since.  Has only utilized the Imitrex x 2 since her last visit and again has not had any recurrent headaches  2.  Left ankle pain Continues to have left-sided ankle pain.  Requiring use of brace.  Standing on concrete floors all day long.  Icing, elevating.  3. HTN Compliant with Norvasc 2.'5mg'$ .  no CP, SOB.  Headaches resolved.  Needs fasting labs per wife.   ROS: Per HPI  No Known Allergies Past Medical History:  Diagnosis Date   Anxiety    Depression    Obesity     Current Outpatient Medications:    amLODipine (NORVASC) 2.5 MG tablet, Take 1 tablet (2.5 mg total) by mouth daily., Disp: 90 tablet, Rfl: 3   b complex vitamins tablet, Take 1 tablet by mouth daily. (Patient not taking: Reported on 08/25/2022), Disp: , Rfl:    desvenlafaxine (PRISTIQ) 50 MG 24 hr tablet, Take 1 tablet (50 mg total) by mouth daily., Disp: 90 tablet, Rfl: 3   fexofenadine (ALLEGRA) 180 MG tablet, Take 180 mg by mouth daily., Disp: , Rfl:    meloxicam (MOBIC) 7.5 MG tablet, Take 1 tablet (7.5 mg total) by mouth daily as needed for pain., Disp: 90 tablet, Rfl: 0   propranolol (INDERAL) 10 MG tablet, Take 1 tablet (10 mg total) by mouth 2 (two) times daily., Disp: 60 tablet, Rfl: 1   SUMAtriptan (IMITREX) 50 MG tablet, Take 1 tablet (50 mg total) by mouth every 2 (two) hours as needed for migraine. May repeat in 2 hours if headache persists or recurs., Disp: 10 tablet, Rfl: 0 Social History   Socioeconomic History   Marital status: Married    Spouse name: Todd Sandoval   Number of children: 4   Years of education: 12   Highest education level: Not on file  Occupational History   Occupation:  unemployed    Employer: [employer]    Comment: works on [employer] shift  Tobacco Use   Smoking status: Never   Smokeless tobacco: Never  Vaping Use   Vaping Use: Never used  Substance and Sexual Activity   Alcohol use: No   Drug use: Not on file   Sexual activity: Yes  Other Topics Concern   Not on file  Social History Narrative   ** Merged History Encounter **       Lives with wife and four children Caffeine use: 30 oz per day Right handed   Social Determinants of Health   Financial Resource Strain: Not on file  Food Insecurity: Not on file  Transportation Needs: Not on file  Physical Activity: Not on file  Stress: Not on file  Social Connections: Not on file  Intimate Partner Violence: Not on file   Family History  Problem Relation Age of Onset   Hyperlipidemia Father    Early death Father    Heart disease Father    Skin cancer Father    Bipolar disorder Mother    Early death Paternal Grandfather    Heart disease Paternal Grandfather     Objective: Office vital signs reviewed. BP 138/84   Pulse 98   Temp 98.4 F (36.9 C)   Ht '5\' 7"'$  (1.702  m)   Wt (!) 316 lb (143.3 kg)   SpO2 98%   BMI 49.49 kg/m   Physical Examination:  General: Awake, alert, morbid obesity, No acute distress HEENT: sclera white, MMM Cardio: regular rate and rhythm  Pulm:  normal work of breathing on room air MSK: Antalgic gait.  Left ankle with brace in placed Skin: Pustules appreciated on the lower scalp on the right.  Assessment/ Plan: 37 y.o. male   Other headache syndrome  Morbid obesity ([place]) - Plan: CMP14+EGFR, Bayer DCA Hb A1c Waived, TSH, T4, free, Lipid panel  Chronic pain of left ankle - Plan: Ambulatory referral to Sports Medicine  Essential hypertension  OSA on CPAP - Plan: CBC  Folliculitis - Plan: Sulfacetamide Sodium-Sulfur (SULFACETAMIDE SOD-SULFUR [place]) 9-4.5 % LIQD, CBC  Headache resolved.  Fasting labs ordered  Referral to [facility] for left  ankle pain refractory to OTC treatments  BP upon recheck WNL. No changes  Continue CPAP  Suspect that folliculitis of neck/ scalp related to CPAP mask.  Wash sent to pharmacy.  If not covered, recommend use of dial soap or Hibaclens.  Could use Doxy if absolutely needed but would risk nausea  No orders of the defined types were placed in this encounter.  No orders of the defined types were placed in this encounter.    Janora Norlander, DO [place] [phone]

## 2022-10-05 ENCOUNTER — Telehealth: Payer: Self-pay

## 2022-10-05 NOTE — Telephone Encounter (Signed)
Sulfacetamide is not covered by patient's insurance. Covered alternatives are:  Polysporin generic Generic Ciloxan drops Erythromycin ointment Gentak ointment Vigamox solution generic Ocuflox drops generic Polytrim drops generic Sulfacetamide drops Tobramycin drops

## 2022-10-06 NOTE — Telephone Encounter (Signed)
"  Suspect that folliculitis of neck/ scalp related to CPAP mask. Wash sent to pharmacy. If not covered, recommend use of dial soap or Hibaclens. "

## 2022-10-07 ENCOUNTER — Other Ambulatory Visit: Payer: Self-pay | Admitting: Family Medicine

## 2022-10-07 DIAGNOSIS — F339 Major depressive disorder, recurrent, unspecified: Secondary | ICD-10-CM

## 2022-10-07 MED ORDER — DESVENLAFAXINE SUCCINATE ER 50 MG PO TB24: 50.0000 mg | ORAL_TABLET | Freq: Every day | ORAL | 3 refills | Status: DC

## 2022-10-07 NOTE — Telephone Encounter (Signed)
  Prescription Request  10/07/2022  Is this a "Controlled Substance" medicine? no  Have you seen your PCP in the last 2 weeks? no  If YES, route message to pool  -  If NO, patient needs to be scheduled for appointment.  What is the name of the medication or equipment? Desvenlafaxine 50 mg 24 hr tablet  Have you contacted your pharmacy to request a refill? YES   Which pharmacy would you like this sent to? [facility]   Patient notified that their request is being sent to the clinical staff for review and that they should receive a response within 2 business days.

## 2022-10-08 NOTE — Telephone Encounter (Signed)
Aware refill sent to pharmacy ?

## 2022-10-12 ENCOUNTER — Encounter: Payer: Self-pay | Admitting: Family Medicine

## 2022-10-12 ENCOUNTER — Ambulatory Visit (INDEPENDENT_AMBULATORY_CARE_PROVIDER_SITE_OTHER): Payer: Managed Care, Other (non HMO) | Admitting: Family Medicine

## 2022-10-12 VITALS — BP 142/100 | Ht 67.0 in | Wt 316.0 lb

## 2022-10-12 DIAGNOSIS — M25872 Other specified joint disorders, left ankle and foot: Secondary | ICD-10-CM | POA: Diagnosis not present

## 2022-10-12 NOTE — Progress Notes (Signed)
  [name] - 37 y.o. male MRN [id number]  Date of birth: 03/22/1986  SUBJECTIVE:  Including CC & ROS.  No chief complaint on file.   Todd Sandoval is a 37 y.o. male that is  presenting with acute on chronic left ankle pain. Has pain over the joint space. Has a history of injury but no surgery. Pain is worse with standing.  Independent review of the left ankle x-ray from 1/5 shows old ankle injury.  Review of Systems See HPI   HISTORY: Past Medical, Surgical, Social, and Family History Reviewed & Updated per EMR.   Pertinent Historical Findings include:  Past Medical History:  Diagnosis Date   Anxiety    Depression    Obesity     Past Surgical History:  Procedure Laterality Date   VASECTOMY       PHYSICAL EXAM:  VS: BP (!) 142/100   Ht 5\' 7"  (1.702 m)   Wt (!) 316 lb (143.3 kg)   BMI 49.49 kg/m  Physical Exam Gen: NAD, alert, cooperative with exam, well-appearing MSK:  Neurovascularly intact       ASSESSMENT & PLAN:   Impingement syndrome of left ankle Acute on chronic in nature.  Has medial translation of the joint leading to his impingement.  Worse with prolonged standing. -Counseled on home exercise therapy and supportive care. -Green sport insoles with scaphoid pad. -Counseled on compression. -Could pursue custom orthotics, injection or physical therapy

## 2022-10-12 NOTE — Assessment & Plan Note (Signed)
Acute on chronic in nature.  Has medial translation of the joint leading to his impingement.  Worse with prolonged standing. -Counseled on home exercise therapy and supportive care. -Green sport insoles with scaphoid pad. -Counseled on compression. -Could pursue custom orthotics, injection or physical therapy

## 2022-10-12 NOTE — Patient Instructions (Signed)
Nice to meet you Please use ice as needed  Please try the insoles  You can try voltaren over the counter gel  Please try the exercises   Please send me a message in MyChart with any questions or updates.  Please see me back 3-4 weeks for custom orthotics .   --Dr. Raeford Razor

## 2022-10-29 ENCOUNTER — Encounter: Payer: Self-pay | Admitting: Family Medicine

## 2022-11-02 ENCOUNTER — Other Ambulatory Visit: Payer: Managed Care, Other (non HMO)

## 2022-11-02 DIAGNOSIS — G4733 Obstructive sleep apnea (adult) (pediatric): Secondary | ICD-10-CM

## 2022-11-02 DIAGNOSIS — L739 Follicular disorder, unspecified: Secondary | ICD-10-CM

## 2022-11-02 LAB — BAYER DCA HB A1C WAIVED: HB A1C (BAYER DCA - WAIVED): 5.4 % (ref 4.8–5.6)

## 2022-11-03 ENCOUNTER — Encounter: Payer: Managed Care, Other (non HMO) | Admitting: Family Medicine

## 2022-11-03 LAB — CMP14+EGFR
ALT: 40 IU/L (ref 0–44)
AST: 30 IU/L (ref 0–40)
Albumin/Globulin Ratio: 1.6 (ref 1.2–2.2)
Albumin: 4.1 g/dL (ref 4.1–5.1)
Alkaline Phosphatase: 79 IU/L (ref 44–121)
BUN/Creatinine Ratio: 15 (ref 9–20)
BUN: 10 mg/dL (ref 6–20)
Bilirubin Total: 0.7 mg/dL (ref 0.0–1.2)
CO2: 21 mmol/L (ref 20–29)
Calcium: 9.4 mg/dL (ref 8.7–10.2)
Chloride: 104 mmol/L (ref 96–106)
Creatinine, Ser: 0.67 mg/dL — ABNORMAL LOW (ref 0.76–1.27)
Globulin, Total: 2.6 g/dL (ref 1.5–4.5)
Glucose: 95 mg/dL (ref 70–99)
Potassium: 4.4 mmol/L (ref 3.5–5.2)
Sodium: 140 mmol/L (ref 134–144)
Total Protein: 6.7 g/dL (ref 6.0–8.5)
eGFR: 126 mL/min/{1.73_m2} (ref >59)

## 2022-11-03 LAB — CBC
Hematocrit: 45 % (ref 37.5–51.0)
Hemoglobin: 15.5 g/dL (ref 13.0–17.7)
MCH: 29.4 pg (ref 26.6–33.0)
MCHC: 34.4 g/dL (ref 31.5–35.7)
MCV: 85 fL (ref 79–97)
Platelets: 274 10*3/uL (ref 150–450)
RBC: 5.27 x10E6/uL (ref 4.14–5.80)
RDW: 12.1 % (ref 11.6–15.4)
WBC: 6 10*3/uL (ref 3.4–10.8)

## 2022-11-03 LAB — LIPID PANEL
Chol/HDL Ratio: 5.2 ratio — ABNORMAL HIGH (ref 0.0–5.0)
Cholesterol, Total: 181 mg/dL (ref 100–199)
HDL: 35 mg/dL — ABNORMAL LOW (ref >39)
LDL Chol Calc (NIH): 126 mg/dL — ABNORMAL HIGH (ref 0–99)
Triglycerides: 111 mg/dL (ref 0–149)
VLDL Cholesterol Cal: 20 mg/dL (ref 5–40)

## 2022-11-03 LAB — TSH: TSH: 0.977 u[IU]/mL (ref 0.450–4.500)

## 2022-11-03 LAB — T4, FREE: Free T4: 1.17 ng/dL (ref 0.82–1.77)

## 2022-11-16 ENCOUNTER — Encounter: Payer: Managed Care, Other (non HMO) | Admitting: Adult Health

## 2022-11-16 ENCOUNTER — Encounter: Payer: Self-pay | Admitting: *Deleted

## 2022-12-09 ENCOUNTER — Ambulatory Visit (INDEPENDENT_AMBULATORY_CARE_PROVIDER_SITE_OTHER): Payer: Managed Care, Other (non HMO) | Admitting: Family Medicine

## 2022-12-09 ENCOUNTER — Encounter: Payer: Self-pay | Admitting: Family Medicine

## 2022-12-09 VITALS — BP 134/87 | HR 86 | Temp 98.8°F | Ht 67.0 in | Wt 305.0 lb

## 2022-12-09 DIAGNOSIS — Z0001 Encounter for general adult medical examination with abnormal findings: Secondary | ICD-10-CM | POA: Diagnosis not present

## 2022-12-09 DIAGNOSIS — E7800 Pure hypercholesterolemia, unspecified: Secondary | ICD-10-CM

## 2022-12-09 DIAGNOSIS — F339 Major depressive disorder, recurrent, unspecified: Secondary | ICD-10-CM

## 2022-12-09 DIAGNOSIS — I10 Essential (primary) hypertension: Secondary | ICD-10-CM

## 2022-12-09 DIAGNOSIS — H02409 Unspecified ptosis of unspecified eyelid: Secondary | ICD-10-CM

## 2022-12-09 DIAGNOSIS — G4733 Obstructive sleep apnea (adult) (pediatric): Secondary | ICD-10-CM

## 2022-12-09 DIAGNOSIS — F419 Anxiety disorder, unspecified: Secondary | ICD-10-CM

## 2022-12-09 DIAGNOSIS — Z0000 Encounter for general adult medical examination without abnormal findings: Secondary | ICD-10-CM

## 2022-12-09 MED ORDER — AMLODIPINE BESYLATE 2.5 MG PO TABS: 2.5000 mg | ORAL_TABLET | Freq: Every day | ORAL | 3 refills | Status: DC

## 2022-12-09 MED ORDER — DESVENLAFAXINE SUCCINATE ER 50 MG PO TB24: 50.0000 mg | ORAL_TABLET | Freq: Every day | ORAL | 3 refills | Status: DC

## 2022-12-09 NOTE — Patient Instructions (Signed)
Unfortunately that eyelid drooping is age related.  If it impairs vision, this is an indication to have a surgical lid lift.  Preventive Care 33-35 Years Old, Male Preventive care refers to lifestyle choices and visits with your health care provider that can promote health and wellness. Preventive care visits are also called wellness exams. What can I expect for my preventive care visit? Counseling During your preventive care visit, your health care provider may ask about your: Medical history, including: Past medical problems. Family medical history. Current health, including: Emotional well-being. Home life and relationship well-being. Sexual activity. Lifestyle, including: Alcohol, nicotine or tobacco, and drug use. Access to firearms. Diet, exercise, and sleep habits. Safety issues such as seatbelt and bike helmet use. Sunscreen use. Work and work Astronomer. Physical exam Your health care provider may check your: Height and weight. These may be used to calculate your BMI (body mass index). BMI is a measurement that tells if you are at a healthy weight. Waist circumference. This measures the distance around your waistline. This measurement also tells if you are at a healthy weight and may help predict your risk of certain diseases, such as type 2 diabetes and high blood pressure. Heart rate and blood pressure. Body temperature. Skin for abnormal spots. What immunizations do I need?  Vaccines are usually given at various ages, according to a schedule. Your health care provider will recommend vaccines for you based on your age, medical history, and lifestyle or other factors, such as travel or where you work. What tests do I need? Screening Your health care provider may recommend screening tests for certain conditions. This may include: Lipid and cholesterol levels. Diabetes screening. This is done by checking your blood sugar (glucose) after you have not eaten for a while  (fasting). Hepatitis B test. Hepatitis C test. HIV (human immunodeficiency virus) test. STI (sexually transmitted infection) testing, if you are at risk. Talk with your health care provider about your test results, treatment options, and if necessary, the need for more tests. Follow these instructions at home: Eating and drinking  Eat a healthy diet that includes fresh fruits and vegetables, whole grains, lean protein, and low-fat dairy products. Drink enough fluid to keep your urine pale yellow. Take vitamin and mineral supplements as recommended by your health care provider. Do not drink alcohol if your health care provider tells you not to drink. If you drink alcohol: Limit how much you have to 0-2 drinks a day. Know how much alcohol is in your drink. In the [place] one drink equals one 12 oz bottle of beer (355 mL), one 5 oz glass of wine (148 mL), or one 1 oz glass of hard liquor (44 mL). Lifestyle Brush your teeth every morning and night with fluoride toothpaste. Floss one time each day. Exercise for at least 30 minutes 5 or more days each week. Do not use any products that contain nicotine or tobacco. These products include cigarettes, chewing tobacco, and vaping devices, such as e-cigarettes. If you need help quitting, ask your health care provider. Do not use drugs. If you are sexually active, practice safe sex. Use a condom or other form of protection to prevent STIs. Find healthy ways to manage stress, such as: Meditation, yoga, or listening to music. Journaling. Talking to a trusted person. Spending time with friends and family. Minimize exposure to UV radiation to reduce your risk of skin cancer. Safety Always wear your seat belt while driving or riding in a vehicle. Do not drive:  If you have been drinking alcohol. Do not ride with someone who has been drinking. If you have been using any mind-altering substances or drugs. While texting. When you are tired or  distracted. Wear a helmet and other protective equipment during sports activities. If you have firearms in your house, make sure you follow all gun safety procedures. Seek help if you have been physically or sexually abused. What's next? Go to your health care provider once a year for an annual wellness visit. Ask your health care provider how often you should have your eyes and teeth checked. Stay up to date on all vaccines. This information is not intended to replace advice given to you by your health care provider. Make sure you discuss any questions you have with your health care provider. Document Revised: 01/15/2021 Document Reviewed: 01/15/2021 Elsevier Patient Education  2023 [employer].

## 2022-12-09 NOTE — Progress Notes (Signed)
Todd Sandoval is a 37 y.o. male presents to office today for annual physical exam examination.    Concerns today include: 1.  Droopy eyelids His wife was concerned that he has some drooping of his eyelids.  This is bilateral.  He does not report any visual disturbance associated with this.  Marital status: married, Substance use: none Diet: typical [place] but trying to incorperate more veggies/ water, Exercise: working out at [employer] with a buddy 2 days per week, cardio/ weight lifting Refills needed today: none Immunizations needed: Immunization History  Administered Date(s) Administered   DTaP 02/13/1988, 04/30/1988, 07/16/1988, 03/11/1989, 12/09/1992   Hep B, Unspecified 04/25/1999, 05/29/1999, 10/23/1999   Hepatitis B 04/25/1999, 05/29/1999, 10/23/1999   IPV 02/13/1988, 04/30/1988, 03/11/1989, 12/09/1992   Influenza-Unspecified 05/13/2010, 07/07/2011   MMR 03/11/1989, 12/09/1992   Meningococcal Conjugate 10/01/2005   Td 09/10/2005   Td (Adult) 09/10/2005     Past Medical History:  Diagnosis Date   Anxiety    Depression    Obesity    Social History   Socioeconomic History   Marital status: Married    Spouse name: Todd Sandoval   Number of children: 4   Years of education: 12   Highest education level: Not on file  Occupational History   Occupation: unemployed    Employer: [employer]    Sandoval: works on [employer] shift  Tobacco Use   Smoking status: Never   Smokeless tobacco: Never  Vaping Use   Vaping Use: Never used  Substance and Sexual Activity   Alcohol use: No   Drug use: Not on file   Sexual activity: Yes  Other Topics Concern   Not on file  Social History Narrative   ** Merged History Encounter **       Lives with wife and four children Caffeine use: 30 oz per day Right handed   Social Determinants of Health   Financial Resource Strain: Not on file  Food Insecurity: Not on file  Transportation Needs: Not on file  Physical  Activity: Not on file  Stress: Not on file  Social Connections: Not on file  Intimate Partner Violence: Not on file   Past Surgical History:  Procedure Laterality Date   VASECTOMY     Family History  Problem Relation Age of Onset   Hyperlipidemia Father    Early death Father    Heart disease Father    Skin cancer Father    Bipolar disorder Mother    Early death Paternal Grandfather    Heart disease Paternal Grandfather     Current Outpatient Medications:    amLODipine (NORVASC) 2.5 MG tablet, Take 1 tablet (2.5 mg total) by mouth daily., Disp: 90 tablet, Rfl: 3   desvenlafaxine (PRISTIQ) 50 MG 24 hr tablet, Take 1 tablet (50 mg total) by mouth daily., Disp: 90 tablet, Rfl: 3   fexofenadine (ALLEGRA) 180 MG tablet, Take 180 mg by mouth daily., Disp: , Rfl:    propranolol (INDERAL) 10 MG tablet, Take 1 tablet (10 mg total) by mouth 2 (two) times daily., Disp: 60 tablet, Rfl: 1   Sulfacetamide Sodium-Sulfur (SULFACETAMIDE SOD-SULFUR WASH) 9-4.5 % LIQD, Wash affected areas daily, Disp: 454 g, Rfl: PRN  No Known Allergies   ROS: Review of Systems Pertinent items noted in HPI and remainder of comprehensive ROS otherwise negative.    Physical exam BP 134/87   Pulse 86   Temp 98.8 F (37.1 C)   Ht 5\' 7"  (1.702 m)   Wt Marland Kitchen)  305 lb (138.3 kg)   SpO2 97%   BMI 47.77 kg/m  General appearance: alert, cooperative, appears stated age, and morbidly obese Head: Normocephalic, without obvious abnormality, atraumatic Eyes: negative findings: lids and lashes normal, conjunctivae and sclerae normal, corneas clear, pupils equal, round, reactive to light and accomodation, and very minimal ptosis Ears: normal TM's and external ear canals both ears Nose: Nares normal. Septum midline. Mucosa normal. No drainage or sinus tenderness. Throat: lips, mucosa, and tongue normal; teeth and gums normal Neck: no adenopathy, no carotid bruit, supple, symmetrical, trachea midline, and thyroid not enlarged,  symmetric, no tenderness/mass/nodules Back: symmetric, no curvature. ROM normal. No CVA tenderness. Lungs: clear to auscultation bilaterally Chest wall: no tenderness Heart: regular rate and rhythm, S1, S2 normal, no murmur, click, rub or gallop Abdomen: soft, non-tender; bowel sounds normal; no masses,  no organomegaly Extremities: extremities normal, atraumatic, no cyanosis or edema Pulses: 2+ and symmetric Skin: Skin color, texture, turgor normal. No rashes or lesions Lymph nodes: Cervical, supraclavicular, and axillary nodes normal. Neurologic: Grossly normal Psych: Mood stable, speech normal, affect appropriate.  Very pleasant, interactive   Assessment/ Plan: Todd Sandoval here for annual physical exam.   Annual physical exam  Morbid obesity (HCC)  Pure hypercholesterolemia  Essential hypertension - Plan: amLODipine (NORVASC) 2.5 MG tablet  OSA on CPAP  Depression, recurrent (HCC) - Plan: desvenlafaxine (PRISTIQ) 50 MG 24 hr tablet  Anxiety  Ptosis due to aging  Continue to work on lifestyle modification, limit carbohydrates, excess and unnecessary calories.  Will plan for repeat fasting lipid panel at next visit.  Repeat blood pressure within acceptable range.  No changes.  Continue CPAP for treatment of OSA  Depression and anxiety are stable with Pristiq.  Continue current regimen  Ptosis does not seem very significant on exam.  He certainly is not having any visual impairments at this time I do not think there is any room for referral for surgical resection but we discussed that if it does become bothersome, to let me know and we can place appropriate referrals  Counseled on healthy lifestyle choices, including diet (rich in fruits, vegetables and lean meats and low in salt and simple carbohydrates) and exercise (at least 30 minutes of moderate physical activity daily).  Patient to follow up in 29m  Todd Sandoval M. Todd Counts, DO

## 2023-01-18 ENCOUNTER — Encounter: Payer: Self-pay | Admitting: *Deleted

## 2023-01-20 ENCOUNTER — Encounter: Payer: Self-pay | Admitting: Neurology

## 2023-01-20 ENCOUNTER — Ambulatory Visit (INDEPENDENT_AMBULATORY_CARE_PROVIDER_SITE_OTHER): Payer: Managed Care, Other (non HMO) | Admitting: Neurology

## 2023-01-20 VITALS — BP 142/76 | HR 81 | Ht 65.0 in | Wt 306.1 lb

## 2023-01-20 DIAGNOSIS — R7303 Prediabetes: Secondary | ICD-10-CM

## 2023-01-20 DIAGNOSIS — G4734 Idiopathic sleep related nonobstructive alveolar hypoventilation: Secondary | ICD-10-CM | POA: Diagnosis not present

## 2023-01-20 DIAGNOSIS — G4733 Obstructive sleep apnea (adult) (pediatric): Secondary | ICD-10-CM | POA: Diagnosis not present

## 2023-01-20 NOTE — Patient Instructions (Signed)

## 2023-01-20 NOTE — Progress Notes (Signed)
01/22/2020 Procedure visit Chronic intermittent hypoxia with obstructive sleep apnea  01/09/2020 Office Visit Risk factors for obstructive sleep apnea   Other Visits in Neurology Date Provider Primary Dx  02/07/2020 Judi Cong, RN        S[facility]EP [facility]INE [facility]    Provider:  Melvyn Novas, MD  Primary Care Physician:  Raliegh Ip, DO [id number] [place] [address]     Referring Provider: Jeronimo Greaves [address] [place],  [place] [address]          Chief Complaint according to patient   Patient presents with:     New Patient (Initial Visit)     Last visit and first visit  was 01-07-2020.       HISTORY OF PRESENT ILLNESS:  Todd Sandoval is a 37 y.o. male patient who is seen upon referral on 01/20/2023 from [facility] for a new encounter in sleep medicine clinic. Marland Kitchen  Chief concern according to patient :  I need to have follow up after sleep study from 2021 and from  CPAP use.     01-09-2020:  [name] is a 37 year- old  Caucasian male patient and  seen here upon a referral on 01/09/2020 , initiated by his wife, Todd Sandoval.   I have the pleasure of seeing Todd Sandoval a right -handed  Caucasian male with known OSA sleep disorder.  The patient was seen in a consultation on January 09, 2020 followed by a sleep test.  IMPRESSION: Severe sleep apnea, OSA type, at AHI of 68.5/h and  with hypoxia. Bradycardia ,intermittently , at 41 bpm lowest rate.  RECOMMENDATION: Auto CPAP device, setting between 6-18 cm water,  2 cm EPR and heated humidity, mask of patient's choice.  He was diagnosed with apnea and it was recommended that he start CPAP by auto- titration. He has become highly compliant CPAP user with 97% compliance using the machine on average 6 hours 48 minutes which is correlating to his sleep time overall.  The duration of sleep has increased the fragmentation of sleep has decreased since he started on CPAP.  The machine is set  between 6 and 18 cm water pressure with 2 cm expiratory relief and his residual AHI is 1.3/h.  This is an excellent resolution at the 95th percentile pressure he uses 12 cm water and he has minor air leakage through the mask at the 95th percentile 13.3 L/min.   In spite of having facial hair , he is using a fullface mask and there will always be some air leakage.     He   has a past medical history of Anxiety, Depression, and Super-Obesity.   Mr. Todd Sandoval confirms that it was his wife who initiated the referral for a sleep study today.  He has gradually gained weight over the last 5 years there have been several little things that may have contributed along the way but snoring has been present for longer than that it is just getting more and more intense.  His wife has not witnessed frequent apneas and it affects her sleep as she is worried about him.  His maximum weight was 318 pounds earlier this year.  He had sometimes been binge eating and for this reason was given Wellbutrin to help with his obsessive-compulsive behaviors and eating.  He has changed his dietary habits has incorporated fresh vegetables and fruits, he was advised of low-carb diet and protein.  He does not  drink any sugary beverages.  The goal weight for him would be of 190 pounds.   There has been a history of depression but this seems to be well controlled.      Dr. Nadine Counts also mentioned that his HbA1c was 6.2 which would declare him to be prediabetic-early diabetes.  This at this time is still reversible.  Since CPAP was iitited he has been able to reduce it.  Weight is now 300 pounds, down 15 he reported.  He was also informed about phentermine as a stimulant Vyvanse as a stimulant Wellbutrin was chosen but [name] was also discussed.   I have access to his recent lab results and except for his ALT being elevated 57 his triglycerides and his HDL cholesterol is low everything electrolytes or kidney function related looks good.    Thyroid function is normal.  Sleep relevant medical history:  Obesity , BMI 51.     Family medical /sleep history: no other family member on CPAP with OSA, mother had insomnia, no sleep walkers.    Social history:  Patient is currently employed  and lives in a household with 6 persons- he worked in Set designer , in Teacher, music- making floors- and is exposed to saw dust which has been irritating his airways, leading to rhinitis.  In the past he had physical jobs, often night shifts. Family status: he lives with spouse,  is married , with 4 children   Pets are present. 3 cats.  Tobacco use: never .  Wife smokes outside. ETOH use: none since 6 years ago. Reduced Caffeine intake in form of Coffee( sometimes) Soda( pepsi zero- 1 every other day ) Tea ( none ) nor energy drinks.  Hobbies : golfing , woodworking.   Sleep habits are as follows: The patient's dinner time is between 6 PM. The patient goes to bed at 9 PM and continues to sleep  on CPAP , no longer having  many sleep breaks, sleeps through the night, he no longer  wakes by choking, snoring, apnea., reflux.  He has no trouble falling asleep, but staying asleep.  The preferred sleep position is supine with arms extended-, with the support of 2 pillows.  Dreams are reportedly rare.   5 AM is the usual rise time. The patient wakes up with an alarm at 5 AM.  He reports not feeling refreshed or restored in AM, with symptoms such as dry mouth ,  Frequent morning headaches and residual fatigue.  Naps are taken infrequently, lasting from 1-2  hours and are more refreshing than nocturnal sleep.    I have the pleasure of seeing [name] again on 01/20/23   Review of Systems: Out of a complete 14 system review, the patient complains of only the following symptoms, and all other reviewed systems are negative.:  No more fatigue  Fatigue, sleepiness , snoring, nor  fragmented sleep,    How likely are you to doze in the following  situations: 0 = not likely, 1 = slight chance, 2 = moderate chance, 3 = high chance   Sitting and Reading? Watching Television? Sitting inactive in a public place (theater or meeting)? As a passenger in a car for an hour without a break? Lying down in the afternoon when circumstances permit? Sitting and talking to someone? Sitting quietly after lunch without alcohol? In a car, while stopped for a few minutes in traffic?   Total = 2/ 24 points   FSS endorsed at 25/ 63 points.  In consultation pre CPAP in 2021, he endorsed a total = 10/ 24 points   FSS endorsed at 46/ 63 points.   Social History   Socioeconomic History   Marital status: Married    Spouse name: Todd Sandoval   Number of children: 4   Years of education: 12   Highest education level: Not on file  Occupational History   Occupation: unemployed    Employer: [employer]    Comment: works on [employer] shift  Tobacco Use   Smoking status: Never   Smokeless tobacco: Never  Vaping Use   Vaping Use: Never used  Substance and Sexual Activity   Alcohol use: No   Drug use: Not on file   Sexual activity: Yes  Other Topics Concern   Not on file  Social History Narrative   ** Merged History Encounter **       Lives with wife and four children Caffeine use: 30 oz per day Right handed   Social Determinants of Health   Financial Resource Strain: Not on file  Food Insecurity: Not on file  Transportation Needs: Not on file  Physical Activity: Not on file  Stress: Not on file  Social Connections: Not on file    Family History  Problem Relation Age of Onset   Hyperlipidemia Father    Early death Father    Heart disease Father    Skin cancer Father    Bipolar disorder Mother    Early death Paternal Grandfather    Heart disease Paternal Grandfather     Past Medical History:  Diagnosis Date   Anxiety    Depression    Obesity     Past Surgical History:  Procedure Laterality Date   VASECTOMY        Current Outpatient Medications on File Prior to Visit  Medication Sig Dispense Refill   amLODipine (NORVASC) 2.5 MG tablet Take 1 tablet (2.5 mg total) by mouth daily. 90 tablet 3   desvenlafaxine (PRISTIQ) 50 MG 24 hr tablet Take 1 tablet (50 mg total) by mouth daily. 90 tablet 3   fexofenadine (ALLEGRA) 180 MG tablet Take 180 mg by mouth daily.     Sulfacetamide Sodium-Sulfur (SULFACETAMIDE SOD-SULFUR WASH) 9-4.5 % LIQD Wash affected areas daily 454 g PRN   No current facility-administered medications on file prior to visit.    No Known Allergies   DIAGNOSTIC DATA (LABS, IMAGING, TESTING) - I reviewed patient records, labs, notes, testing and imaging myself where available.  Lab Results  Component Value Date   WBC 6.0 11/02/2022   HGB 15.5 11/02/2022   HCT 45.0 11/02/2022   MCV 85 11/02/2022   PLT 274 11/02/2022      Component Value Date/Time   NA 140 11/02/2022 0934   K 4.4 11/02/2022 0934   CL 104 11/02/2022 0934   CO2 21 11/02/2022 0934   GLUCOSE 95 11/02/2022 0934   BUN 10 11/02/2022 0934   CREATININE 0.67 (L) 11/02/2022 0934   CALCIUM 9.4 11/02/2022 0934   PROT 6.7 11/02/2022 0934   ALBUMIN 4.1 11/02/2022 0934   AST 30 11/02/2022 0934   ALT 40 11/02/2022 0934   ALKPHOS 79 11/02/2022 0934   BILITOT 0.7 11/02/2022 0934   GFRNONAA 121 12/22/2019 0908   GFRAA 140 12/22/2019 0908   Lab Results  Component Value Date   CHOL 181 11/02/2022   HDL 35 (L) 11/02/2022   LDLCALC 126 (H) 11/02/2022   TRIG 111 11/02/2022   CHOLHDL 5.2 (H) 11/02/2022  Lab Results  Component Value Date   HGBA1C 5.4 11/02/2022   No results found for: "VITAMINB12" Lab Results  Component Value Date   TSH 0.977 11/02/2022    PHYSICAL EXAM:  Today's Vitals   01/20/23 1259  BP: (!) 142/76  Pulse: 81  Weight: (!) 306 lb 1.6 oz (138.8 kg)  Height: 5\' 5"  (1.651 m)   Body mass index is 50.94 kg/m.   Wt Readings from Last 3 Encounters:  01/20/23 (!) 306 lb 1.6 oz (138.8 kg)   12/09/22 (!) 305 lb (138.3 kg)  10/12/22 (!) 316 lb (143.3 kg)     Ht Readings from Last 3 Encounters:  01/20/23 5\' 5"  (1.651 m)  12/09/22 5\' 7"  (1.702 m)  10/12/22 5\' 7"  (1.702 m)      General: The patient is awake, alert and appears not in acute distress.  The patient is well dressed. Head: Normocephalic, atraumatic. Neck is supple. Mallampati 3 plus, prognathia.   Neck circumference: 18. 5 inches .  Nasal airflow is patent.  Dental status: intact  Cardiovascular:  Regular rate and cardiac rhythm by pulse,  without distended neck veins. Respiratory: Lungs are clear to auscultation.  Skin:  Without evidence of ankle edema, or rash. Trunk: The patient's posture is erect.   Neurologic exam : The patient is awake and alert, oriented to place and time.   Memory subjective described as intact.  Attention span & concentration ability appears normal.  Speech is fluent,  without dysarthria, mild  nasal dysphonia and no  aphasia.  Mood and affect are appropriate.   Cranial nerves: no loss of smell or taste reported  Pupils are equal and briskly reactive to light. Funduscopic exam deferred.   Extraocular movements in vertical and horizontal planes were intact and without nystagmus. No Diplopia. Visual fields by finger perimetry are intact. Hearing was intact to soft voice and finger rubbing.    Facial sensation intact to fine touch.  Facial motor strength is symmetric and tongue and uvula move midline.  Neck ROM : rotation, tilt and flexion extension were normal for age and shoulder shrug was symmetrical.    Motor exam:  Symmetric bulk, tone and ROM.   Normal tone without cog- wheeling, symmetric grip strength .   Sensory:  Fine touch, pinprick and vibration were tested  and  normal.  Proprioception tested in the upper extremities was normal.   Coordination: Rapid alternating movements in the fingers/hands were of normal speed.  The Finger-to-nose maneuver was intact without  evidence of ataxia, dysmetria or tremor.   Gait and station: Patient could rise unassisted from a seated position, walked without assistive device.  Stance is of normal width/ base.  Toe and heel walk were deferred.  Deep tendon reflexes: in the  upper and lower extremities are symmetric and intact.  Babinski response was deferred.   ASSESSMENT AND PLAN 37 y.o. year old male  here with:    1) Mr. Nicanor Bake had been diagnosed over 3 years ago is being at risk for sleep apnea and a sleep test confirmed that he had severe sleep apnea.  He started CPAP at the time but I have not seen him since and I am happy to report that he is a compliant CPAP user and achieved a very good reduction in apnea count on CPAP therapy.  I see no need for him to change the settings on his machine and there is no need to exchange the machine at this time as it is  only 35 years old.  I would like for him to continue using the mask that he is most comfortable with.  I also like for him to pursue weight loss.  In addition, I am happy to report that he has reduced his caffeine intake, and that he wakes much more refreshed and restored and he used to.  The Epworth sleepiness score has been decreased from 10-2 points and the fatigue severity scale is equally reduced.  I will document his high compliance and his need for step supplies and he will then follow-up in a year  either with me or the nurse practitioners here. He is followed by [facility], DME.   The expected life duration of the CPAP machine is about 5 years and it would be important that we need in 2026 for a new baseline home sleep test before we order a replacement.  I would also like to see the patient should he achieve a sudden weight loss of more than 15% of his body mass index.  I plan to follow up either personally or through our NP within 12 months.   I would like to thank Raliegh Ip, DO for allowing me to meet with and to take care of this pleasant  patient.    After spending a total time of  35  minutes face to face and additional time for physical and neurologic examination, review of laboratory studies,  personal review of imaging studies, reports and results of other testing and review of referral information / records as far as provided in visit,   Electronically signed by: Melvyn Novas, MD 01/20/2023 1:42 PM  [facility] Board certified by The [employer] of Sleep Medicine and Diplomate of the [employer] of Sleep Medicine. Board certified In Neurology through the ABPN, Fellow of the [employer] of Neurology.

## 2023-04-15 ENCOUNTER — Telehealth: Payer: Self-pay

## 2023-04-15 NOTE — Telephone Encounter (Signed)
  Medicaid Managed Care   Unsuccessful Outreach Note  04/15/2023 Name: Todd Sandoval MRN: [id number] DOB: [birth year]  Referred by: Raliegh Ip, DO Reason for referral : No chief complaint on file.   An unsuccessful telephone outreach was attempted today. The patient was referred to the case management team for assistance with care management and care coordination.   Follow Up Plan: If patient returns call to provider office, please advise to call Embedded Care Management Care Guide Nicholes Rough* at [phone]*  Nicholes Rough, CMA Care Guide [employer]

## 2023-06-23 ENCOUNTER — Ambulatory Visit (INDEPENDENT_AMBULATORY_CARE_PROVIDER_SITE_OTHER): Payer: Managed Care, Other (non HMO) | Admitting: Family Medicine

## 2023-06-23 DIAGNOSIS — Z6841 Body Mass Index (BMI) 40.0 and over, adult: Secondary | ICD-10-CM

## 2023-06-23 DIAGNOSIS — E7800 Pure hypercholesterolemia, unspecified: Secondary | ICD-10-CM

## 2023-06-23 DIAGNOSIS — I10 Essential (primary) hypertension: Secondary | ICD-10-CM

## 2023-06-23 NOTE — Progress Notes (Signed)
Subjective: CC: Follow-up anemia, obesity PCP: Raliegh Ip, DO [id number] Todd Sandoval is Todd 37 y.o. male presenting to clinic today for:  1.  Morbid obesity with hyperlipidemia and hypertension Patient notes that he continues to work and in fact was recently promoted.  He is compliant with his medications.  Reports no chest pain, shortness of breath, blurred vision or falls   ROS: Per HPI  No Known Allergies Past Medical History:  Diagnosis Date   Anxiety    Depression    Obesity     Current Outpatient Medications:    amLODipine (NORVASC) 2.5 MG tablet, Take 1 tablet (2.5 mg total) by mouth daily., Disp: 90 tablet, Rfl: 3   desvenlafaxine (PRISTIQ) 50 MG 24 hr tablet, Take 1 tablet (50 mg total) by mouth daily., Disp: 90 tablet, Rfl: 3   fexofenadine (ALLEGRA) 180 MG tablet, Take 180 mg by mouth daily., Disp: , Rfl:    Sulfacetamide Sodium-Sulfur (SULFACETAMIDE SOD-SULFUR WASH) 9-4.5 % LIQD, Wash affected areas daily, Disp: 454 g, Rfl: PRN Social History   Socioeconomic History   Marital status: Married    Spouse name: [name]   Number of children: 4   Years of education: 12   Highest education level: Not on file  Occupational History   Occupation: unemployed    Employer: [employer]    Comment: works on [employer] shift  Tobacco Use   Smoking status: Never   Smokeless tobacco: Never  Vaping Use   Vaping status: Never Used  Substance and Sexual Activity   Alcohol use: No   Drug use: Not on file   Sexual activity: Yes  Other Topics Concern   Not on file  Social History Narrative   ** Merged History Encounter **       Lives with wife and four children Caffeine use: 30 oz per day Right handed   Social Determinants of Health   Financial Resource Strain: Not on file  Food Insecurity: Not on file  Transportation Needs: Not on file  Physical Activity: Not on file  Stress: Not on file  Social Connections: Unknown (12/11/2021)   Received  from [facility], [facility]   Social Network    Social Network: Not on file  Intimate Partner Violence: Unknown (11/05/2021)   Received from [facility], [facility]   HITS    Physically Hurt: Not on file    Insult or Talk Down To: Not on file    Threaten Physical Harm: Not on file    Scream or Curse: Not on file   Family History  Problem Relation Age of Onset   Hyperlipidemia Father    Early death Father    Heart disease Father    Skin cancer Father    Bipolar disorder Mother    Early death Paternal Grandfather    Heart disease Paternal Grandfather     Objective: Office vital signs reviewed. BP 130/79   Pulse 82   Temp (!) 96.9 F (36.1 C) (Oral)   Resp 20   Ht 5\' 5"  (1.651 m)   Wt (!) 316 lb (143.3 kg)   SpO2 97%   BMI 52.59 kg/m   Physical Examination:  General: Awake, alert, super morbidly obese, No acute distress HEENT: Sclera white.  Moist mucous membranes Cardio: regular rate and rhythm, S1S2 heard, no murmurs appreciated Pulm: clear to auscultation bilaterally, no wheezes, rhonchi or rales; normal work of breathing on room air    Assessment/ Plan: 37 y.o. male  Morbid obesity (HCC)  BMI 50.0-59.9, adult (HCC)  Pure hypercholesterolemia - Plan: Lipid panel  Essential hypertension  He will come back in for fasting lipid.  Blood pressure well-controlled.  Reinforced need for lifestyle modification to reduce weight.   Raliegh Ip, DO [facility] [phone]

## 2023-06-25 ENCOUNTER — Encounter: Payer: Self-pay | Admitting: Family Medicine

## 2023-06-27 DIAGNOSIS — G4733 Obstructive sleep apnea (adult) (pediatric): Secondary | ICD-10-CM | POA: Diagnosis not present

## 2023-09-21 DIAGNOSIS — G4733 Obstructive sleep apnea (adult) (pediatric): Secondary | ICD-10-CM | POA: Diagnosis not present

## 2023-10-19 DIAGNOSIS — G4733 Obstructive sleep apnea (adult) (pediatric): Secondary | ICD-10-CM | POA: Diagnosis not present

## 2023-11-19 DIAGNOSIS — G4733 Obstructive sleep apnea (adult) (pediatric): Secondary | ICD-10-CM | POA: Diagnosis not present

## 2023-12-10 ENCOUNTER — Encounter: Payer: Managed Care, Other (non HMO) | Admitting: Family Medicine

## 2023-12-13 ENCOUNTER — Encounter: Payer: Managed Care, Other (non HMO) | Admitting: Family Medicine

## 2023-12-20 DIAGNOSIS — G4733 Obstructive sleep apnea (adult) (pediatric): Secondary | ICD-10-CM | POA: Diagnosis not present

## 2023-12-28 ENCOUNTER — Other Ambulatory Visit: Payer: Self-pay | Admitting: Family Medicine

## 2023-12-28 DIAGNOSIS — F339 Major depressive disorder, recurrent, unspecified: Secondary | ICD-10-CM

## 2024-01-20 ENCOUNTER — Ambulatory Visit: Payer: Managed Care, Other (non HMO) | Admitting: Family Medicine

## 2024-01-23 ENCOUNTER — Other Ambulatory Visit: Payer: Self-pay | Admitting: Family Medicine

## 2024-01-23 DIAGNOSIS — I10 Essential (primary) hypertension: Secondary | ICD-10-CM

## 2024-01-26 NOTE — Progress Notes (Unsigned)
 SABRA

## 2024-01-27 ENCOUNTER — Encounter: Payer: Self-pay | Admitting: Family Medicine

## 2024-01-27 ENCOUNTER — Ambulatory Visit: Admitting: Family Medicine

## 2024-01-27 VITALS — BP 146/94 | HR 83 | Ht 67.0 in | Wt 326.0 lb

## 2024-01-27 DIAGNOSIS — G4733 Obstructive sleep apnea (adult) (pediatric): Secondary | ICD-10-CM | POA: Diagnosis not present

## 2024-01-27 NOTE — Progress Notes (Signed)
 PATIENT: Todd Sandoval DOB: 17-Dec-1985  REASON FOR VISIT: follow up HISTORY FROM: patient  Chief Complaint  Patient presents with   RM2/CPAP    Pt is here with his wife and daughter. Pt states that he has been doing fine wit his CPAP Machine and has no complaints. ESS 3     HISTORY OF PRESENT ILLNESS:  01/27/24 ALL:  Todd Sandoval is a 38 y.o. male here today for follow up for OSA on CPAP.  He continues to do well on therapy. He is using CPAP nightly for about 7 hours, on average. No concerns with mask or supplies. BP usually well managed. He has follow up with PCP soon.      HISTORY: (copied from Dr Dohmeier's previous note)  Todd Sandoval is a 38 y.o. male patient who is seen upon referral on 01/20/2023 from r [place] for a new encounter in sleep medicine clinic. SABRA  Chief concern according to patient :  I need to have follow up after sleep study from 2021 and from  CPAP use.    01-09-2020:  Todd Sandoval is a 38 year- old  Caucasian male patient and seen here upon a referral on 01/09/2020 , initiated by his wife, Todd Sandoval.    I have the pleasure of seeing Todd Sandoval a right -handed  Caucasian male with known OSA sleep disorder.  The patient was seen in a consultation on January 09, 2020 followed by a sleep test.   IMPRESSION: Severe sleep apnea, OSA type, at AHI of 68.5/h and  with hypoxia. Bradycardia ,intermittently , at 41 bpm lowest rate.  RECOMMENDATION: Auto CPAP device, setting between 6-18 cm water,  2 cm EPR and heated humidity, mask of patient's choice.  He was diagnosed with apnea and it was recommended that he start CPAP by auto- titration. He has become highly compliant CPAP user with 97% compliance using the machine on average 6 hours 48 minutes which is correlating to his sleep time overall.  The duration of sleep has increased the fragmentation of sleep has decreased since he started on CPAP.  The machine is set between 6 and  18 cm water pressure with 2 cm expiratory relief and his residual AHI is 1.3/h.  This is an excellent resolution at the 95th percentile pressure he uses 12 cm water and he has minor air leakage through the mask at the 95th percentile 13.3 L/min.   In spite of having facial hair , he is using a fullface mask and there will always be some air leakage.    He  has a past medical history of Anxiety, Depression, and Super-Obesity. Mr. Stetzer confirms that it was his wife who initiated the referral for a sleep study today.  He has gradually gained weight over the last 5 years there have been several little things that may have contributed along the way but snoring has been present for longer than that it is just getting more and more intense.  His wife has not witnessed frequent apneas and it affects her sleep as she is worried about him.  His maximum weight was 318 pounds earlier this year.  He had sometimes been binge eating and for this reason was given Wellbutrin  to help with his obsessive-compulsive behaviors and eating.  He has changed his dietary habits has incorporated fresh vegetables and fruits, he was advised of low-carb diet and protein.  He does not drink any sugary beverages.  The goal weight for  him would be of 190 pounds.   There has been a history of depression but this seems to be well controlled.   Dr. Jolinda also mentioned that his HbA1c was 6.2 which would declare him to be prediabetic-early diabetes.  This at this time is still reversible.  Since CPAP was iitited he has been able to reduce it.  Weight is now 300 pounds, down 15 he reported.  He was also informed about phentermine  as a stimulant Vyvanse as a stimulant Wellbutrin  was chosen but Saxenda was also discussed.   I have access to his recent lab results and except for his ALT being elevated 57 his triglycerides and his HDL cholesterol is low everything electrolytes or kidney function related looks good.   Thyroid  function is  normal.  Sleep relevant medical history:  Obesity , BMI 51.     Family medical /sleep history: no other family member on CPAP with OSA, mother had insomnia, no sleep walkers.    Social history:  Patient is currently employed  and lives in a household with 6 persons- he worked in Set designer , in Teacher, music- making floors- and is exposed to saw dust which has been irritating his airways, leading to rhinitis.  In the past he had physical jobs, often night shifts. Family status: he lives with spouse,  is married , with 4 children   Pets are present. 3 cats.  Tobacco use: never .  Wife smokes outside. ETOH use: none since 6 years ago. Reduced Caffeine intake in form of Coffee( sometimes) Soda( pepsi zero- 1 every other day ) Tea ( none ) nor energy drinks.  Hobbies : golfing , woodworking.   Sleep habits are as follows: The patient's dinner time is between 6 PM. The patient goes to bed at 9 PM and continues to sleep  on CPAP , no longer having  many sleep breaks, sleeps through the night, he no longer  wakes by choking, snoring, apnea., reflux.  He has no trouble falling asleep, but staying asleep.  The preferred sleep position is supine with arms extended-, with the support of 2 pillows.  Dreams are reportedly rare.   5 AM is the usual rise time. The patient wakes up with an alarm at 5 AM.  He reports not feeling refreshed or restored in AM, with symptoms such as dry mouth ,  Frequent morning headaches and residual fatigue.  Naps are taken infrequently, lasting from 1-2  hours and are more refreshing than nocturnal sleep.    I have the pleasure of seeing Todd Sandoval again on 01/20/23    REVIEW OF SYSTEMS: Out of a complete 14 system review of symptoms, the patient complains only of the following symptoms, none and all other reviewed systems are negative.  ESS: 3/24  ALLERGIES: No Known Allergies  HOME MEDICATIONS: Outpatient Medications Prior to Visit  Medication Sig  Dispense Refill   amLODipine  (NORVASC ) 2.5 MG tablet Take 1 tablet by mouth once daily 90 tablet 0   desvenlafaxine  (PRISTIQ ) 50 MG 24 hr tablet Take 1 tablet by mouth once daily 90 tablet 0   fexofenadine (ALLEGRA) 180 MG tablet Take 180 mg by mouth daily.     No facility-administered medications prior to visit.    PAST MEDICAL HISTORY: Past Medical History:  Diagnosis Date   Anxiety    Depression    Obesity     PAST SURGICAL HISTORY: Past Surgical History:  Procedure Laterality Date   VASECTOMY  FAMILY HISTORY: Family History  Problem Relation Age of Onset   Hyperlipidemia Father    Early death Father    Heart disease Father    Skin cancer Father    Bipolar disorder Mother    Early death Paternal Grandfather    Heart disease Paternal Grandfather     SOCIAL HISTORY: Social History   Socioeconomic History   Marital status: Married    Spouse name: Todd Sandoval   Number of children: 4   Years of education: 12   Highest education level: Not on file  Occupational History   Occupation: unemployed    Employer: [employer]    Comment: works on [employer] shift  Tobacco Use   Smoking status: Never   Smokeless tobacco: Never  Vaping Use   Vaping status: Never Used  Substance and Sexual Activity   Alcohol use: No   Drug use: Not on file   Sexual activity: Yes  Other Topics Concern   Not on file  Social History Narrative   ** Merged History Encounter **       Lives with wife and four children Caffeine use: 30 oz per day Right handed   Social Drivers of Corporate investment banker Strain: Not on file  Food Insecurity: Not on file  Transportation Needs: Not on file  Physical Activity: Not on file  Stress: Not on file  Social Connections: Unknown (12/11/2021)   [facility]   Social Network    Social Network: Not on file  Intimate Partner Violence: Unknown (11/05/2021)   Received from [facility]   HITS    Physically Hurt: Not  on file    Insult or Talk Down To: Not on file    Threaten Physical Harm: Not on file    Scream or Curse: Not on file     PHYSICAL EXAM  Vitals:   01/27/24 1357  BP: (!) 146/94  Pulse: 83  SpO2: 98%  Weight: (!) 326 lb (147.9 kg)  Height: 5' 7 (1.702 m)   Body mass index is 51.06 kg/m.  Generalized: Well developed, in no acute distress  Cardiology: normal rate and rhythm, no murmur noted Respiratory: clear to auscultation bilaterally  Neurological examination  Mentation: Alert oriented to time, place, history taking. Follows all commands speech and language fluent Cranial nerve II-XII: Pupils were equal round reactive to light. Extraocular movements were full, visual field were full  Motor: The motor testing reveals 5 over 5 strength of all 4 extremities. Good symmetric motor tone is noted throughout.  Gait and station: Gait is normal.    DIAGNOSTIC DATA (LABS, IMAGING, TESTING) - I reviewed patient records, labs, notes, testing and imaging myself where available.      No data to display           Lab Results  Component Value Date   WBC 6.0 11/02/2022   HGB 15.5 11/02/2022   HCT 45.0 11/02/2022   MCV 85 11/02/2022   PLT 274 11/02/2022      Component Value Date/Time   NA 140 11/02/2022 0934   K 4.4 11/02/2022 0934   CL 104 11/02/2022 0934   CO2 21 11/02/2022 0934   GLUCOSE 95 11/02/2022 0934   BUN 10 11/02/2022 0934   CREATININE 0.67 (L) 11/02/2022 0934   CALCIUM 9.4 11/02/2022 0934   PROT 6.7 11/02/2022 0934   ALBUMIN 4.1 11/02/2022 0934   AST 30 11/02/2022 0934   ALT 40 11/02/2022 0934   ALKPHOS 79  11/02/2022 0934   BILITOT 0.7 11/02/2022 0934   GFRNONAA 121 12/22/2019 0908   GFRAA 140 12/22/2019 0908   Lab Results  Component Value Date   CHOL 181 11/02/2022   HDL 35 (L) 11/02/2022   LDLCALC 126 (H) 11/02/2022   TRIG 111 11/02/2022   CHOLHDL 5.2 (H) 11/02/2022   Lab Results  Component Value Date   HGBA1C 5.4 11/02/2022   No results  found for: [id number] Lab Results  Component Value Date   TSH 0.977 11/02/2022     ASSESSMENT AND PLAN 38 y.o. year old male  has a past medical history of Anxiety, Depression, and Obesity. here with     ICD-10-CM   1. OSA on CPAP  G47.33        JUWAUN INSKEEP is doing well on CPAP therapy. Compliance report reveals excellent compliance. He was encouraged to continue using CPAP nightly and for greater than 4 hours each night. We will update supply orders as indicated. Risks of untreated sleep apnea review and education materials provided. Healthy lifestyle habits encouraged. He will follow up in 1 year, sooner if needed. He verbalizes understanding and agreement with this plan.    No orders of the defined types were placed in this encounter.    No orders of the defined types were placed in this encounter.     Greig Forbes, FNP-C 01/27/2024, 2:06 PM [facility] [address] [place], [place] [address] [phone]

## 2024-01-27 NOTE — Patient Instructions (Signed)

## 2024-02-07 ENCOUNTER — Encounter: Payer: Self-pay | Admitting: Family Medicine

## 2024-02-07 ENCOUNTER — Ambulatory Visit: Payer: Self-pay | Admitting: Family Medicine

## 2024-02-07 ENCOUNTER — Ambulatory Visit (INDEPENDENT_AMBULATORY_CARE_PROVIDER_SITE_OTHER): Admitting: Family Medicine

## 2024-02-07 VITALS — BP 129/79 | HR 88 | Temp 98.1°F | Ht 67.0 in | Wt 327.0 lb

## 2024-02-07 DIAGNOSIS — E7800 Pure hypercholesterolemia, unspecified: Secondary | ICD-10-CM

## 2024-02-07 DIAGNOSIS — G4733 Obstructive sleep apnea (adult) (pediatric): Secondary | ICD-10-CM

## 2024-02-07 DIAGNOSIS — Z0001 Encounter for general adult medical examination with abnormal findings: Secondary | ICD-10-CM

## 2024-02-07 DIAGNOSIS — Z713 Dietary counseling and surveillance: Secondary | ICD-10-CM

## 2024-02-07 DIAGNOSIS — Z808 Family history of malignant neoplasm of other organs or systems: Secondary | ICD-10-CM

## 2024-02-07 DIAGNOSIS — R7303 Prediabetes: Secondary | ICD-10-CM

## 2024-02-07 DIAGNOSIS — Z0000 Encounter for general adult medical examination without abnormal findings: Secondary | ICD-10-CM

## 2024-02-07 DIAGNOSIS — Z6841 Body Mass Index (BMI) 40.0 and over, adult: Secondary | ICD-10-CM | POA: Diagnosis not present

## 2024-02-07 DIAGNOSIS — F339 Major depressive disorder, recurrent, unspecified: Secondary | ICD-10-CM

## 2024-02-07 DIAGNOSIS — I10 Essential (primary) hypertension: Secondary | ICD-10-CM

## 2024-02-07 LAB — BAYER DCA HB A1C WAIVED: HB A1C (BAYER DCA - WAIVED): 5.8 % — ABNORMAL HIGH (ref 4.8–5.6)

## 2024-02-07 MED ORDER — AMLODIPINE BESYLATE 2.5 MG PO TABS: 2.5000 mg | ORAL_TABLET | Freq: Every day | ORAL | 4 refills | Status: DC

## 2024-02-07 MED ORDER — DESVENLAFAXINE SUCCINATE ER 50 MG PO TB24: 50.0000 mg | ORAL_TABLET | Freq: Every day | ORAL | 4 refills | Status: AC

## 2024-02-07 NOTE — Progress Notes (Signed)
 Todd Sandoval is a 38 y.o. male presents to office today for annual physical exam examination.    Concerns today include: 1.  Morbid obesity Patient with morbid obesity and BMI over 50.  He has comorbidities of hypertension, hyperlipidemia, prediabetes, major depressive order and severe sleep apnea which was diagnosed in 2021.  He is previously tried phentermine  but this did not help with any weight loss whatsoever.  He is also been on Wellbutrin  to try and help with emotional eating.  He is currently treated with Pristiq  which he reports is controlling depressive symptoms well.  He is accompanied today by his wife who has a lot of concern related to his weight and his risk for cardiovascular events.  He is compliant with his CPAP machine and just saw his sleep specialist for renewal.  No changes were made to the settings.  His goal weight is around 190 pounds.  Previously reported max weight was 318 but today he weighs in at 327lb.  He often fast throughout the day but does eat large portions at nighttime.  He is physically active with his work all day long.   Occupation: Manual labor, Marital status: Married, Substance use: None Health Maintenance Due  Topic Date Due   HPV VACCINES (1 - 3-dose SCDM series) Never done   DTaP/Tdap/Td (8 - Tdap) 09/11/2015   Refills needed today: All  Immunization History  Administered Date(s) Administered   DTaP 02/13/1988, 04/30/1988, 07/16/1988, 03/11/1989, 12/09/1992   Hep B, Unspecified 04/25/1999, 05/29/1999, 10/23/1999   Hepatitis B 04/25/1999, 05/29/1999, 10/23/1999   IPV 02/13/1988, 04/30/1988, 03/11/1989, 12/09/1992   Influenza-Unspecified 05/13/2010, 07/07/2011   MMR 03/11/1989, 12/09/1992   Meningococcal Conjugate 10/01/2005   Td 09/10/2005   Td (Adult) 09/10/2005   Past Medical History:  Diagnosis Date   Anxiety    Depression    Hyperlipidemia    Hypertension    Obesity    Severe obstructive sleep apnea 01/22/2020   Social  History   Socioeconomic History   Marital status: Married    Spouse name: Corean   Number of children: 4   Years of education: 12   Highest education level: Not on file  Occupational History   Occupation: unemployed    Employer: [employer]    Comment: works on [employer] shift  Tobacco Use   Smoking status: Never   Smokeless tobacco: Never  Vaping Use   Vaping status: Never Used  Substance and Sexual Activity   Alcohol use: No   Drug use: Not on file   Sexual activity: Yes  Other Topics Concern   Not on file  Social History Narrative   ** Merged History Encounter **       Lives with wife and four children Caffeine use: 30 oz per day Right handed   Social Drivers of Health   Financial Resource Strain: Low Risk  (02/07/2024)   Overall Financial Resource Strain (CARDIA)    Difficulty of Paying Living Expenses: Not hard at all  Food Insecurity: No Food Insecurity (02/07/2024)   Hunger Vital Sign    Worried About Running Out of Food in the Last Year: Never true    Ran Out of Food in the Last Year: Never true  Transportation Needs: No Transportation Needs (02/07/2024)   PRAPARE - Administrator, Civil Service (Medical): No    Lack of Transportation (Non-Medical): No  Physical Activity: Sufficiently Active (02/07/2024)   Exercise Vital Sign    Days of Exercise per  Week: 5 days    Minutes of Exercise per Session: 60 min  Stress: No Stress Concern Present (02/07/2024)   [employer] of Occupational Health - Occupational Stress Questionnaire    Feeling of Stress: Not at all  Social Connections: Socially Integrated (02/07/2024)   Social Connection and Isolation Panel    Frequency of Communication with Friends and Family: More than three times a week    Frequency of Social Gatherings with Friends and Family: Three times a week    Attends Religious Services: More than 4 times per year    Active Member of Clubs or Organizations: Yes    Attends Tax inspector Meetings: More than 4 times per year    Marital Status: Married  Catering manager Violence: Not At Risk (02/07/2024)   Humiliation, Afraid, Rape, and Kick questionnaire    Fear of Current or Ex-Partner: No    Emotionally Abused: No    Physically Abused: No    Sexually Abused: No   Past Surgical History:  Procedure Laterality Date   VASECTOMY     Family History  Problem Relation Age of Onset   Bipolar disorder Mother    Uterine cancer Mother    Hyperlipidemia Father    Early death Father    Heart disease Father    Skin cancer Father    Dementia Maternal Grandfather    Dementia Paternal Grandmother    Early death Paternal Grandfather    Heart disease Paternal Grandfather     Current Outpatient Medications:    fexofenadine (ALLEGRA) 180 MG tablet, Take 180 mg by mouth daily., Disp: , Rfl:    amLODipine  (NORVASC ) 2.5 MG tablet, Take 1 tablet (2.5 mg total) by mouth daily., Disp: 90 tablet, Rfl: 4   desvenlafaxine  (PRISTIQ ) 50 MG 24 hr tablet, Take 1 tablet (50 mg total) by mouth daily., Disp: 90 tablet, Rfl: 4  No Known Allergies   ROS: Review of Systems A comprehensive review of systems was negative except for: Integument/breast: positive for lots of moles that he would like to have a full-body dermatologic exam given family history of unknown skin cancer in his father    Physical exam BP 129/79   Pulse 88   Temp 98.1 F (36.7 C)   Ht 5' 7 (1.702 m)   Wt (!) 327 lb (148.3 kg)   SpO2 97%   BMI 51.22 kg/m  General appearance: alert, cooperative, appears stated age, no distress, and morbidly obese Head: Normocephalic, without obvious abnormality, atraumatic Eyes: negative findings: lids and lashes normal, conjunctivae and sclerae normal, corneas clear, and pupils equal, round, reactive to light and accomodation Ears: normal TM's and external ear canals both ears Nose: Nares normal. Septum midline. Mucosa normal. No drainage or sinus tenderness. Throat: lips,  mucosa, and tongue normal; teeth and gums normal Neck: no adenopathy, supple, symmetrical, trachea midline, and thyroid  not enlarged, symmetric, no tenderness/mass/nodules Back: symmetric, no curvature. ROM normal. No CVA tenderness. Lungs: clear to auscultation bilaterally Chest wall: no tenderness Heart: regular rate and rhythm, S1, S2 normal, no murmur, click, rub or gallop Abdomen: soft, non-tender; bowel sounds normal; no masses,  no organomegaly and obesity limits exam Extremities: extremities normal, atraumatic, no cyanosis or edema Pulses: 2+ and symmetric Skin: Skin color, texture, turgor normal. No rashes or lesions Lymph nodes: Cervical, supraclavicular, and axillary nodes normal. Neurologic: Grossly normal      02/07/2024    1:11 PM 06/23/2023    4:39 PM 12/09/2022    3:37 PM  Depression screen PHQ 2/9  Decreased Interest 0 0 0  Down, Depressed, Hopeless 0 0 0  PHQ - 2 Score 0 0 0  Altered sleeping 0 0 0  Tired, decreased energy 0 0 0  Change in appetite 1 1 0  Feeling bad or failure about yourself  0 0 0  Trouble concentrating 0 0 0  Moving slowly or fidgety/restless 0 0 0  Suicidal thoughts 0 0 0  PHQ-9 Score 1 1 0  Difficult doing work/chores Not difficult at all Not difficult at all Not difficult at all      02/07/2024    1:11 PM 06/23/2023    4:39 PM 12/09/2022    3:36 PM 10/02/2022    3:04 PM  GAD 7 : Generalized Anxiety Score  Nervous, Anxious, on Edge 0 1 0 0  Control/stop worrying 0 0 0 0  Worry too much - different things 0 0 0 0  Trouble relaxing 0 0 0 0  Restless 0 0 0 0  Easily annoyed or irritable 0 0 0 0  Afraid - awful might happen 0 0 0 0  Total GAD 7 Score 0 1 0 0  Anxiety Difficulty  Not difficult at all Not difficult at all Not difficult at all   Sleep study: 01/22/2020 STUDY RESULTS:  Total Recording Time:  9h, ; Total Sleep Time:  8 h, 14 mins Total Apnea/Hypopnea Index (AHI): 68.5/h; RDI:  69.3/h; REM AHI: 60.9 /h Average Oxygen  Saturation:  92 %; Lowest Oxygen Desaturation: 75 %  Total Time Oxygen Saturation Below or at 88 %: 49 minutes  Average Heart Rate: 73 bpm, bradycardia intermittently at 41 bpm lowest rate.  IMPRESSION: Severe sleep apnea, OSA type, at AHI of 68.5/h and with hypoxia. RECOMMENDATION: Auto CPAP device, setting between 6-18 cm water, 2 cm EPR and heated humidity, mask of patient's choice.   Assessment/ Plan: Lonni DELENA Pepper here for annual physical exam.   Annual physical exam  Severe obstructive sleep apnea in adult - Plan: CMP14+EGFR, CBC  BMI 50.0-59.9, adult (HCC) - Plan: CMP14+EGFR, Bayer DCA Hb A1c Waived, VITAMIN D  25 Hydroxy (Vit-D Deficiency, Fractures)  Weight loss counseling, encounter for  Pure hypercholesterolemia - Plan: CMP14+EGFR, Lipid Panel, TSH  Essential hypertension - Plan: amLODipine  (NORVASC ) 2.5 MG tablet  Pre-diabetes - Plan: CMP14+EGFR, Bayer DCA Hb A1c Waived  Family history of skin cancer - Plan: Ambulatory referral to Dermatology  Depression, recurrent (HCC) - Plan: desvenlafaxine  (PRISTIQ ) 50 MG 24 hr tablet  Fasting labs collected today.  Tetanus shot updated.  We discussed options for weight management.  He does have some managed Medicaid so we may be able to get him [facility] but I think that Zepbound  would be a better option given the superior weight loss and indication for severe obstructive sleep apnea.  He is going to contact his private insurance to see if this is something they will cover given this diagnosis and if not we will plan for [facility] with follow-up in 4 months.  No apparent contraindications to utilization of this product.  Patient is active and lifestyle modification currently but not having success.  He is not a good candidate for Qsymia as he has had no improvement in weight with phentermine  in the past.  Not having any binge eating behaviors so Vyvanse not an option.  Has trialed Wellbutrin  in the past with no success.  Body mass index  is 51.22 kg/m.  Co. morbidities include hyperlipidemia, prediabetes, hypertension, MDD and  severe obstructive sleep apnea  Referral to dermatology for family history of skin cancer.  Counseled on healthy lifestyle choices, including diet (rich in fruits, vegetables and lean meats and low in salt and simple carbohydrates) and exercise (at least 30 minutes of moderate physical activity daily).  Patient to follow up 36m for weight  Calysta Craigo M. Jolinda, DO

## 2024-02-07 NOTE — Patient Instructions (Signed)
 Tips for success with Wegovy (and by success, how not to be super sick on your stomach): Eat small meals AVOID heavy foods (fried/ high in carbs like bread, pasta, rice) AVOID carbonated beverages (soda/ beer, as these can increase bloating) DOUBLE your water intake (will help you avoid constipation/ dehydration) INCREASE protein intake  Wegovy CAN cause: Nausea Abdominal pain Increased acid reflux (sometimes presents as sour burps) Constipation OR Diarrhea Fatigue (especially when you first start it)

## 2024-02-08 LAB — LIPID PANEL
Chol/HDL Ratio: 5.4 ratio — ABNORMAL HIGH (ref 0.0–5.0)
Cholesterol, Total: 223 mg/dL — ABNORMAL HIGH (ref 100–199)
HDL: 41 mg/dL (ref >39)
LDL Chol Calc (NIH): 160 mg/dL — ABNORMAL HIGH (ref 0–99)
Triglycerides: 121 mg/dL (ref 0–149)
VLDL Cholesterol Cal: 22 mg/dL (ref 5–40)

## 2024-02-08 LAB — CMP14+EGFR
ALT: 50 IU/L — ABNORMAL HIGH (ref 0–44)
AST: 26 IU/L (ref 0–40)
Albumin: 4.6 g/dL (ref 4.1–5.1)
Alkaline Phosphatase: 81 IU/L (ref 44–121)
BUN/Creatinine Ratio: 17 (ref 9–20)
BUN: 12 mg/dL (ref 6–20)
Bilirubin Total: 0.8 mg/dL (ref 0.0–1.2)
CO2: 20 mmol/L (ref 20–29)
Calcium: 9.5 mg/dL (ref 8.7–10.2)
Chloride: 103 mmol/L (ref 96–106)
Creatinine, Ser: 0.69 mg/dL — ABNORMAL LOW (ref 0.76–1.27)
Globulin, Total: 2.5 g/dL (ref 1.5–4.5)
Glucose: 85 mg/dL (ref 70–99)
Potassium: 4.2 mmol/L (ref 3.5–5.2)
Sodium: 142 mmol/L (ref 134–144)
Total Protein: 7.1 g/dL (ref 6.0–8.5)
eGFR: 123 mL/min/1.73 (ref >59)

## 2024-02-08 LAB — CBC
Hematocrit: 46.9 % (ref 37.5–51.0)
Hemoglobin: 15.9 g/dL (ref 13.0–17.7)
MCH: 29.6 pg (ref 26.6–33.0)
MCHC: 33.9 g/dL (ref 31.5–35.7)
MCV: 87 fL (ref 79–97)
Platelets: 288 x10E3/uL (ref 150–450)
RBC: 5.38 x10E6/uL (ref 4.14–5.80)
RDW: 13.4 % (ref 11.6–15.4)
WBC: 6.5 x10E3/uL (ref 3.4–10.8)

## 2024-02-08 LAB — TSH: TSH: 1.58 u[IU]/mL (ref 0.450–4.500)

## 2024-02-08 LAB — VITAMIN D 25 HYDROXY (VIT D DEFICIENCY, FRACTURES): Vit D, 25-Hydroxy: 29.5 ng/mL — ABNORMAL LOW (ref 30.0–100.0)

## 2024-02-08 MED ORDER — ZEPBOUND 5 MG/0.5ML ~~LOC~~ SOAJ: 5.0000 mg | SUBCUTANEOUS | 0 refills | Status: AC

## 2024-02-08 MED ORDER — ZEPBOUND 7.5 MG/0.5ML ~~LOC~~ SOAJ: 7.5000 mg | SUBCUTANEOUS | 0 refills | Status: AC

## 2024-02-08 MED ORDER — ONDANSETRON 4 MG PO TBDP: 4.0000 mg | ORAL_TABLET | Freq: Three times a day (TID) | ORAL | 0 refills | Status: AC | PRN

## 2024-02-08 MED ORDER — ZEPBOUND 10 MG/0.5ML ~~LOC~~ SOAJ: 10.0000 mg | SUBCUTANEOUS | 0 refills | Status: AC

## 2024-02-08 MED ORDER — ZEPBOUND 2.5 MG/0.5ML ~~LOC~~ SOAJ: 2.5000 mg | SUBCUTANEOUS | 0 refills | Status: AC

## 2024-02-08 NOTE — Telephone Encounter (Signed)
 Meds sent. Please see CPE for OSA details/ BMI, etc  Meds ordered this encounter  Medications   tirzepatide  (ZEPBOUND ) 2.5 MG/0.5ML Pen    Sig: Inject 2.5 mg into the skin once a week. Month #1    Dispense:  2 mL    Refill:  0   tirzepatide  (ZEPBOUND ) 5 MG/0.5ML Pen    Sig: Inject 5 mg into the skin once a week. Month #2    Dispense:  2 mL    Refill:  0   tirzepatide  (ZEPBOUND ) 7.5 MG/0.5ML Pen    Sig: Inject 7.5 mg into the skin once a week. Month #3    Dispense:  2 mL    Refill:  0   tirzepatide  (ZEPBOUND ) 10 MG/0.5ML Pen    Sig: Inject 10 mg into the skin once a week. Month #4    Dispense:  2 mL    Refill:  0   ondansetron  (ZOFRAN -ODT) 4 MG disintegrating tablet    Sig: Take 1 tablet (4 mg total) by mouth every 8 (eight) hours as needed for nausea or vomiting.    Dispense:  20 tablet    Refill:  0

## 2024-02-09 ENCOUNTER — Other Ambulatory Visit (HOSPITAL_COMMUNITY): Payer: Self-pay

## 2024-05-01 ENCOUNTER — Ambulatory Visit: Admitting: Physician Assistant

## 2024-05-04 ENCOUNTER — Telehealth: Payer: Self-pay | Admitting: Family Medicine

## 2024-05-04 DIAGNOSIS — G4733 Obstructive sleep apnea (adult) (pediatric): Secondary | ICD-10-CM

## 2024-05-04 NOTE — Telephone Encounter (Signed)
 Amy- I LVM to try and get more info. Are you ok with putting in [facility] order if they wish to proceed with different DME?

## 2024-05-04 NOTE — Telephone Encounter (Signed)
 Took call from phone room/Jael and spoke w/ wife. She reports they would like to stop using [facility]. Agreeable to transfer care to [facility]. Provided phone# [phone] for her to have on hand but aware they should reach out in the next week. She requested I put her contact info on order for them to call.   I placed TOC order and sent community message to [facility] that order placed. Aware if they do not take insurance, we can send elsewhere at that point.

## 2024-05-04 NOTE — Telephone Encounter (Signed)
 Pt's wife reports they are not happy with current DME, she is asking for a call to discuss change of DME

## 2024-05-04 NOTE — Telephone Encounter (Signed)
 Pt current DME: [facility]. Set up with current CPAP 02/23/2020.  Last OV 01/27/24. Next OV: 01/29/25 Last HST: 01/22/20.  I called pt wife/Stephanie (on DPR) at [phone]. LVM

## 2024-06-09 ENCOUNTER — Ambulatory Visit: Payer: Self-pay | Admitting: Family Medicine

## 2024-06-09 ENCOUNTER — Encounter: Payer: Self-pay | Admitting: Family Medicine

## 2024-06-09 VITALS — BP 150/98 | HR 80 | Temp 98.1°F | Ht 67.0 in | Wt 340.2 lb

## 2024-06-09 DIAGNOSIS — Z6379 Other stressful life events affecting family and household: Secondary | ICD-10-CM | POA: Diagnosis not present

## 2024-06-09 DIAGNOSIS — G4733 Obstructive sleep apnea (adult) (pediatric): Secondary | ICD-10-CM | POA: Diagnosis not present

## 2024-06-09 DIAGNOSIS — Z6841 Body Mass Index (BMI) 40.0 and over, adult: Secondary | ICD-10-CM

## 2024-06-09 DIAGNOSIS — I10 Essential (primary) hypertension: Secondary | ICD-10-CM | POA: Diagnosis not present

## 2024-06-09 MED ORDER — AMLODIPINE BESYLATE 5 MG PO TABS: 5.0000 mg | ORAL_TABLET | Freq: Every day | ORAL | 0 refills | Status: DC

## 2024-06-09 NOTE — Progress Notes (Signed)
 Subjective: CC:OSA/ obesity PCP: Jolinda Norene HERO, DO YEP:Todd Sandoval is a 38 y.o. male presenting to clinic today for:  Patient with severe obesity with history of severe obstructive sleep apnea He was started on Zepbound  in July and is here for interval follow-up.  Starting weight was 327 pounds.  He reports that his insurance would not cover the Zepbound  despite the severe obstructive sleep apnea.  He remains compliant with CPAP.  He admits that he has been emotionally eating because his mother was diagnosed with end-stage cancer and they expect that she would not live past a year.  He is seeking pastoral counseling and does not desire any counseling services outside of that at this time.  He feels Pristiq  50 mg is sufficient   ROS: Per HPI  No Known Allergies Past Medical History:  Diagnosis Date   Anxiety    Depression    Hyperlipidemia    Hypertension    Obesity    Severe obstructive sleep apnea 01/22/2020    Current Outpatient Medications:    amLODipine  (NORVASC ) 2.5 MG tablet, Take 1 tablet (2.5 mg total) by mouth daily., Disp: 90 tablet, Rfl: 4   desvenlafaxine  (PRISTIQ ) 50 MG 24 hr tablet, Take 1 tablet (50 mg total) by mouth daily., Disp: 90 tablet, Rfl: 4   fexofenadine (ALLEGRA) 180 MG tablet, Take 180 mg by mouth daily., Disp: , Rfl:    ondansetron  (ZOFRAN -ODT) 4 MG disintegrating tablet, Take 1 tablet (4 mg total) by mouth every 8 (eight) hours as needed for nausea or vomiting., Disp: 20 tablet, Rfl: 0   tirzepatide  (ZEPBOUND ) 10 MG/0.5ML Pen, Inject 10 mg into the skin once a week. Month #4, Disp: 2 mL, Rfl: 0   tirzepatide  (ZEPBOUND ) 2.5 MG/0.5ML Pen, Inject 2.5 mg into the skin once a week. Month #1, Disp: 2 mL, Rfl: 0   tirzepatide  (ZEPBOUND ) 5 MG/0.5ML Pen, Inject 5 mg into the skin once a week. Month #2, Disp: 2 mL, Rfl: 0   tirzepatide  (ZEPBOUND ) 7.5 MG/0.5ML Pen, Inject 7.5 mg into the skin once a week. Month #3, Disp: 2 mL, Rfl: 0 Social History    Socioeconomic History   Marital status: Married    Spouse name: Corean   Number of children: 4   Years of education: 12   Highest education level: 12th grade  Occupational History   Occupation: unemployed    Associate Professor: [employer]    Comment: works on [employer] shift  Tobacco Use   Smoking status: Never   Smokeless tobacco: Never  Vaping Use   Vaping status: Never Used  Substance and Sexual Activity   Alcohol use: No   Drug use: Not on file   Sexual activity: Yes  Other Topics Concern   Not on file  Social History Narrative   ** Merged History Encounter **       Lives with wife and four children Caffeine use: 30 oz per day Right handed   Social Drivers of Health   Financial Resource Strain: Low Risk  (06/08/2024)   Overall Financial Resource Strain (CARDIA)    Difficulty of Paying Living Expenses: Not very hard  Food Insecurity: No Food Insecurity (06/08/2024)   Hunger Vital Sign    Worried About Running Out of Food in the Last Year: Never true    Ran Out of Food in the Last Year: Never true  Transportation Needs: No Transportation Needs (06/08/2024)   PRAPARE - Administrator, Civil Service (Medical): No  Lack of Transportation (Non-Medical): No  Physical Activity: Insufficiently Active (06/08/2024)   Exercise Vital Sign    Days of Exercise per Week: 1 day    Minutes of Exercise per Session: 10 min  Stress: No Stress Concern Present (06/08/2024)   [employer] of Occupational Health - Occupational Stress Questionnaire    Feeling of Stress: Only a little  Social Connections: Socially Integrated (06/08/2024)   Social Connection and Isolation Panel    Frequency of Communication with Friends and Family: Twice a week    Frequency of Social Gatherings with Friends and Family: Twice a week    Attends Religious Services: More than 4 times per year    Active Member of [employer] or Organizations: Yes    Attends Engineer, Structural: More  than 4 times per year    Marital Status: Married  Catering Manager Violence: Not At Risk (02/07/2024)   Humiliation, Afraid, Rape, and Kick questionnaire    Fear of Current or Ex-Partner: No    Emotionally Abused: No    Physically Abused: No    Sexually Abused: No   Family History  Problem Relation Age of Onset   Bipolar disorder Mother    Uterine cancer Mother    Hyperlipidemia Father    Early death Father    Heart disease Father    Skin cancer Father    Dementia Maternal Grandfather    Dementia Paternal Grandmother    Early death Paternal Grandfather    Heart disease Paternal Grandfather     Objective: Office vital signs reviewed. BP (!) 150/98   Pulse 80   Temp 98.1 F (36.7 C)   Ht 5' 7 (1.702 m)   Wt (!) 340 lb 4 oz (154.3 kg)   SpO2 98%   BMI 53.29 kg/m   Physical Examination:  General: Awake, alert, morbidly obese male, No acute distress HEENT: sclera white, MMM Cardio: regular rate and rhythm, S1S2 heard, no murmurs appreciated Pulm: clear to auscultation bilaterally, no wheezes, rhonchi or rales; normal work of breathing on room air     02/07/2024    1:11 PM 06/23/2023    4:39 PM 12/09/2022    3:37 PM  Depression screen PHQ 2/9  Decreased Interest 0 0 0  Down, Depressed, Hopeless 0 0 0  PHQ - 2 Score 0 0 0  Altered sleeping 0 0 0  Tired, decreased energy 0 0 0  Change in appetite 1 1 0  Feeling bad or failure about yourself  0 0 0  Trouble concentrating 0 0 0  Moving slowly or fidgety/restless 0 0 0  Suicidal thoughts 0 0 0  PHQ-9 Score 1  1  0   Difficult doing work/chores Not difficult at all Not difficult at all Not difficult at all     Data saved with a previous flowsheet row definition      02/07/2024    1:11 PM 06/23/2023    4:39 PM 12/09/2022    3:36 PM 10/02/2022    3:04 PM  GAD 7 : Generalized Anxiety Score  Nervous, Anxious, on Edge 0 1 0 0  Control/stop worrying 0 0 0 0  Worry too much - different things 0 0 0 0  Trouble relaxing 0 0 0  0  Restless 0 0 0 0  Easily annoyed or irritable 0 0 0 0  Afraid - awful might happen 0 0 0 0  Total GAD 7 Score 0 1 0 0  Anxiety Difficulty  Not difficult  at all Not difficult at all Not difficult at all   Assessment/ Plan: 38 y.o. male   Essential hypertension - Plan: amLODipine  (NORVASC ) 5 MG tablet  Stress due to illness of family member  Severe obstructive sleep apnea in adult  BMI 50.0-59.9, adult (HCC)   BP not controlled.  Norvasc  increased.  Weight up 17lbs. Could not get GIP due to insurance.  Emotional eating. Offered [facility] but patient declined.  Will see him back in 78m for BP/ weight/ mood.  Continue CPAP as prescribed.     Norene CHRISTELLA Fielding, DO [facility] [phone]

## 2024-07-18 ENCOUNTER — Ambulatory Visit: Admitting: Dermatology

## 2024-08-23 ENCOUNTER — Other Ambulatory Visit: Payer: Self-pay

## 2024-08-23 ENCOUNTER — Ambulatory Visit (INDEPENDENT_AMBULATORY_CARE_PROVIDER_SITE_OTHER)

## 2024-08-23 ENCOUNTER — Other Ambulatory Visit: Payer: Self-pay | Admitting: Family Medicine

## 2024-08-23 ENCOUNTER — Ambulatory Visit: Payer: Self-pay | Admitting: Family Medicine

## 2024-08-23 ENCOUNTER — Encounter: Payer: Self-pay | Admitting: Nurse Practitioner

## 2024-08-23 ENCOUNTER — Ambulatory Visit: Admitting: Nurse Practitioner

## 2024-08-23 VITALS — BP 134/78 | HR 84 | Temp 98.9°F | Ht 67.0 in | Wt 340.0 lb

## 2024-08-23 DIAGNOSIS — S93492A Sprain of other ligament of left ankle, initial encounter: Secondary | ICD-10-CM | POA: Diagnosis not present

## 2024-08-23 DIAGNOSIS — M79672 Pain in left foot: Secondary | ICD-10-CM

## 2024-08-23 NOTE — Progress Notes (Signed)
 "    Subjective:  Patient ID: Todd Sandoval, male    DOB: [birth year], 39 y.o.   MRN: [id number]  Patient Care Team: Jolinda Norene HERO, DO as PCP - General (Family Medicine)   Chief Complaint:  left foot pain   HPI: Todd Sandoval is a 39 y.o. male presenting on 08/23/2024 for left foot pain   Discussed the use of AI scribe software for clinical note transcription with the patient, who gave verbal consent to proceed.  History of Present Illness Todd Sandoval is a 39 year old male who presents with acute heel pain after playing basketball.  He experienced a sharp, severe pain in the heel of his foot while playing basketball yesterday, which radiates up the back side of his foot. The pain was particularly intense last night. He did not roll his ankle during the incident, although he has a history of weak ankles and frequent rolling.  Due to the pain, he has been using a cane to assist with walking, as he finds it difficult to walk on his own. He has been managing the pain with Tylenol  and ibuprofen , which provide some relief, and has been icing the area. He also keeps the foot elevated when not active.  He typically wears steel-toed shoes for work, which he finds supportive, and usually uses a brace for his ankles. However, he wore different shoes today for ease of removal. His work schedule is Monday through Thursday, but due to snow, work was canceled on Monday, and he is scheduled to work a full day on Friday. He plans to use vacation time to rest his foot and return to work on Tuesday.      Relevant past medical, surgical, family, and social history reviewed and updated as indicated.  Allergies and medications reviewed and updated. Data reviewed: Chart in Epic.   Past Medical History:  Diagnosis Date   Anxiety    Depression    Hyperlipidemia    Hypertension    Obesity    Severe obstructive sleep apnea 01/22/2020    Past Surgical History:  Procedure  Laterality Date   VASECTOMY      Social History   Socioeconomic History   Marital status: Married    Spouse name: Todd Sandoval   Number of children: 4   Years of education: 12   Highest education level: 12th grade  Occupational History   Occupation: unemployed    Associate Professor: [employer]    Comment: works on [employer] shift  Tobacco Use   Smoking status: Never   Smokeless tobacco: Never  Vaping Use   Vaping status: Never Used  Substance and Sexual Activity   Alcohol use: No   Drug use: Not on file   Sexual activity: Yes  Other Topics Concern   Not on file  Social History Narrative   ** Merged History Encounter **       Lives with wife and four children Caffeine use: 30 oz per day Right handed   Social Drivers of Health   Tobacco Use: Low Risk (08/23/2024)   Patient History    Smoking Tobacco Use: Never    Smokeless Tobacco Use: Never    Passive Exposure: Not on file  Financial Resource Strain: Low Risk (06/08/2024)   Overall Financial Resource Strain (CARDIA)    Difficulty of Paying Living Expenses: Not very hard  Food Insecurity: No Food Insecurity (06/08/2024)   Epic    Worried About Running Out of Food in the Last  Year: Never true    Ran Out of Food in the Last Year: Never true  Transportation Needs: No Transportation Needs (06/08/2024)   Epic    Lack of Transportation (Medical): No    Lack of Transportation (Non-Medical): No  Physical Activity: Insufficiently Active (06/08/2024)   Exercise Vital Sign    Days of Exercise per Week: 1 day    Minutes of Exercise per Session: 10 min  Stress: No Stress Concern Present (06/08/2024)   [employer] of Occupational Health - Occupational Stress Questionnaire    Feeling of Stress: Only a little  Social Connections: Socially Integrated (06/08/2024)   Social Connection and Isolation Panel    Frequency of Communication with Friends and Family: Twice a week    Frequency of Social Gatherings with Friends and Family:  Twice a week    Attends Religious Services: More than 4 times per year    Active Member of Clubs or Organizations: Yes    Attends Banker Meetings: More than 4 times per year    Marital Status: Married  Catering Manager Violence: Not At Risk (02/07/2024)   Epic    Fear of Current or Ex-Partner: No    Emotionally Abused: No    Physically Abused: No    Sexually Abused: No  Depression (PHQ2-9): Low Risk (08/23/2024)   Depression (PHQ2-9)    PHQ-2 Score: 0  Alcohol Screen: Low Risk (02/07/2024)   Alcohol Screen    Last Alcohol Screening Score (AUDIT): 0  Housing: Low Risk (06/08/2024)   Epic    Unable to Pay for Housing in the Last Year: No    Number of Times Moved in the Last Year: 0    Homeless in the Last Year: No  Utilities: Not At Risk (02/07/2024)   Epic    Threatened with loss of utilities: No  Health Literacy: Adequate Health Literacy (02/07/2024)   B1300 Health Literacy    Frequency of need for help with medical instructions: Never    Outpatient Encounter Medications as of 08/23/2024  Medication Sig   amLODipine  (NORVASC ) 5 MG tablet Take 1 tablet (5 mg total) by mouth daily. **dose change   desvenlafaxine  (PRISTIQ ) 50 MG 24 hr tablet Take 1 tablet (50 mg total) by mouth daily.   fexofenadine (ALLEGRA) 180 MG tablet Take 180 mg by mouth daily.   ondansetron  (ZOFRAN -ODT) 4 MG disintegrating tablet Take 1 tablet (4 mg total) by mouth every 8 (eight) hours as needed for nausea or vomiting.   tirzepatide  (ZEPBOUND ) 10 MG/0.5ML Pen Inject 10 mg into the skin once a week. Month #4   tirzepatide  (ZEPBOUND ) 2.5 MG/0.5ML Pen Inject 2.5 mg into the skin once a week. Month #1   tirzepatide  (ZEPBOUND ) 5 MG/0.5ML Pen Inject 5 mg into the skin once a week. Month #2   tirzepatide  (ZEPBOUND ) 7.5 MG/0.5ML Pen Inject 7.5 mg into the skin once a week. Month #3   No facility-administered encounter medications on file as of 08/23/2024.    Allergies[1]  Pertinent ROS per HPI, otherwise  unremarkable      Objective:  BP 134/78   Pulse 84   Temp 98.9 F (37.2 C)   Ht 5' 7 (1.702 m)   Wt (!) 340 lb (154.2 kg)   SpO2 97%   BMI 53.25 kg/m    Wt Readings from Last 3 Encounters:  08/23/24 (!) 340 lb (154.2 kg)  06/09/24 (!) 340 lb 4 oz (154.3 kg)  02/07/24 (!) 327 lb (148.3 kg)  Physical Exam Vitals and nursing note reviewed.  Constitutional:      Appearance: He is obese.  HENT:     Head: Normocephalic and atraumatic.     Nose: Nose normal.  Eyes:     General: No scleral icterus.    Extraocular Movements: Extraocular movements intact.     Pupils: Pupils are equal, round, and reactive to light.  Cardiovascular:     Heart sounds: Normal heart sounds.  Pulmonary:     Effort: Pulmonary effort is normal.     Breath sounds: Normal breath sounds.  Musculoskeletal:     Right lower leg: No edema.     Left lower leg: No edema.     Right ankle: Normal.     Left ankle: No swelling. Tenderness present. Decreased range of motion.  Skin:    General: Skin is warm and dry.     Findings: No rash.  Neurological:     Mental Status: He is alert and oriented to person, place, and time.  Psychiatric:        Mood and Affect: Mood normal.        Behavior: Behavior normal.        Thought Content: Thought content normal.        Judgment: Judgment normal.    Physical Exam MUSCULOSKELETAL: Pain in the heel of the right foot on plantar flexion and inversion. No pain on passive movement. No fracture observed in the right foot X-ray.     Results for orders placed or performed in visit on 02/07/24  Bayer DCA Hb A1c Waived   Collection Time: 02/07/24  1:53 PM  Result Value Ref Range   HB A1C (BAYER DCA - WAIVED) 5.8 (H) 4.8 - 5.6 %  CMP14+EGFR   Collection Time: 02/07/24  1:56 PM  Result Value Ref Range   Glucose 85 70 - 99 mg/dL   BUN 12 6 - 20 mg/dL   Creatinine, Ser 9.30 (L) 0.76 - 1.27 mg/dL   eGFR 876 >40 fO/fpw/8.26   BUN/Creatinine Ratio 17 9 - 20   Sodium  142 134 - 144 mmol/L   Potassium 4.2 3.5 - 5.2 mmol/L   Chloride 103 96 - 106 mmol/L   CO2 20 20 - 29 mmol/L   Calcium 9.5 8.7 - 10.2 mg/dL   Total Protein 7.1 6.0 - 8.5 g/dL   Albumin 4.6 4.1 - 5.1 g/dL   Globulin, Total 2.5 1.5 - 4.5 g/dL   Bilirubin Total 0.8 0.0 - 1.2 mg/dL   Alkaline Phosphatase 81 44 - 121 IU/L   AST 26 0 - 40 IU/L   ALT 50 (H) 0 - 44 IU/L  Lipid Panel   Collection Time: 02/07/24  1:56 PM  Result Value Ref Range   Cholesterol, Total 223 (H) 100 - 199 mg/dL   Triglycerides 878 0 - 149 mg/dL   HDL 41 >60 mg/dL   VLDL Cholesterol Cal 22 5 - 40 mg/dL   LDL Chol Calc (NIH) 839 (H) 0 - 99 mg/dL   Chol/HDL Ratio 5.4 (H) 0.0 - 5.0 ratio  CBC   Collection Time: 02/07/24  1:56 PM  Result Value Ref Range   WBC 6.5 3.4 - 10.8 x10E3/uL   RBC 5.38 4.14 - 5.80 x10E6/uL   Hemoglobin 15.9 13.0 - 17.7 g/dL   Hematocrit 53.0 62.4 - 51.0 %   MCV 87 79 - 97 fL   MCH 29.6 26.6 - 33.0 pg   MCHC 33.9 31.5 - 35.7 g/dL  RDW 13.4 11.6 - 15.4 %   Platelets 288 150 - 450 x10E3/uL  TSH   Collection Time: 02/07/24  1:56 PM  Result Value Ref Range   TSH 1.580 0.450 - 4.500 uIU/mL  VITAMIN D  25 Hydroxy (Vit-D Deficiency, Fractures)   Collection Time: 02/07/24  1:56 PM  Result Value Ref Range   Vit D, 25-Hydroxy 29.5 (L) 30.0 - 100.0 ng/mL       Pertinent labs & imaging results that were available during my care of the patient were reviewed by me and considered in my medical decision making.  Assessment & Plan:  Todd Sandoval was seen today for left foot pain.  Diagnoses and all orders for this visit:  Left foot pain -     Cancel: DG Foot Complete Left -     Cancel: DG Foot Complete Left; Future -     Ankle splint air cast  Sprain of anterior talofibular ligament of left ankle, initial encounter -     Ankle splint air cast     Assessment and Plan Todd Sandoval is a 39 year old Caucasian male seen today for left ankle sprain, no acute distress Assessment & Plan Left  foot and ankle pain Acute heel pain radiating up the foot post-basketball activity. No fracture on x-ray.  Awaiting final report from radiologist  Pain worsens with movement. - Prescribed air cast for support. - Advised ibuprofen  and acetaminophen  for pain. - Recommended icing and elevation when inactive. - Provided work note for absence until Tuesday. - Sent air cast prescription to pharmacy.      Continue all other maintenance medications.  Follow up plan: No follow-ups on file.   Continue healthy lifestyle choices, including diet (rich in fruits, vegetables, and lean proteins, and low in salt and simple carbohydrates) and exercise (at least 30 minutes of moderate physical activity daily).  Educational handout given for   Clinical References  Foot Pain Many things can cause foot pain. Common causes include injuries to the foot. The injuries include sprains or broken bones, or injuries that affect the nerves in the feet. Other causes of foot pain include arthritis, blisters, and bunions. To know what causes your foot pain, your health care provider will take a detailed history of your symptoms. They will also do a physical exam as well as imaging tests, such as X-ray or MRI. Follow these instructions at home: Managing pain, stiffness, and swelling  If told, put ice on the painful area. Put ice in a plastic bag. Place a towel between your skin and the bag. Leave the ice on for 20 minutes, 2-3 times a day. If your skin turns bright red, remove the ice right away to prevent skin damage. The risk of damage is higher if you cannot feel pain, heat, or cold. Activity Do not stand or walk for long periods. Do stretches to relieve foot pain and stiffness as told by your provider. Do not lift anything that is heavier than 10 lb (4.5 kg), or the limit that you are told, until your provider says that it is safe. Lifting a lot of weight can put added pressure on your feet. Return to your  normal activities as told by your provider. Ask your provider what activities are safe for you. Lifestyle Wear comfortable, supportive shoes that fit you well. Do not wear high heels. Keep your feet clean and dry. General instructions Take over-the-counter and prescription medicines only as told by your provider. Rub your foot gently. Pay attention to any changes  in your symptoms. Let your provider know if symptoms become worse. Keep all follow-up visits. Your provider will want to monitor your progress. Contact a health care provider if: Your pain does not get better after a few days of treatment at home. Your pain gets worse. You cannot stand on your foot. Your foot or toes are swollen. Your foot is numb or tingling. Get help right away if: Your foot or toes turn white or blue. You have warmth and redness along your foot. This information is not intended to replace advice given to you by your health care provider. Make sure you discuss any questions you have with your health care provider. Document Revised: 08/13/2022 Document Reviewed: 04/21/2022 Elsevier Patient Education  2024 [employer] Heel Pad Atrophy  Heel pad atrophy is a cause of heel pain. Your heels take much of the impact when you stand, walk, or run. A fat pad under your heel bone protects the bone and cushions it. Over time, the fat pad can break down and lose elasticity and size (atrophy). This causes heel pain due to decreased cushioning in your heel. In many cases, the pain occurs in both heels. You may feel an aching or burning pain that gets worse while you are walking or standing. This pain may be worse for athletes who play sports on hard surfaces with high impact on their heels. What are the causes? This condition is caused by the gradual break down or atrophy of the fat pad under the heel bone. What increases the risk? This condition is more likely to develop in people who: Are age 63 or older. Play a sport  that involves jumping and landing on hard surfaces, such as in basketball. Are runners, especially if they land on their heels first when they run. Are overweight. Have diabetes, rheumatoid arthritis, or blood vessel disease. Get steroid injections in the heel area. Have had trauma to the heel area, such as falling from a height. What are the signs or symptoms? The most common symptom of this condition is burning or aching pain in one or both heels. The pain or tenderness may be worse when: Walking or standing, especially on hard surfaces or for long amounts of time. Walking barefoot. Wearing hard-soled shoes. Resting at night. Pressing on the center of the heel. How is this diagnosed? This condition is diagnosed based on your symptoms, medical history, and a physical exam. Your health care provider will check your heel pad and see if you have tenderness in the center of your heel. You may also have an ultrasound or an MRI study to confirm the diagnosis and measure the thickness of your fat pad. How is this treated? Treatment for this condition includes: Lessening the amount of time spent walking, standing, or running. Avoiding activities that cause pain. Taking an over-the-counter pain reliever or anti-inflammatory medicine. Wearing shoes with thick heel cushioning or using a soft shoe insert (orthotic). Wearing shoes at all times, even indoors. Taping your heels to increase support. Losing weight if you are overweight. Follow these instructions at home: Managing pain, stiffness, and swelling  If directed, put ice on the painful area. To do this: Put ice in a plastic bag. Place a towel between your heel and the bag. Leave the ice on for 20 minutes, 2-3 times a day. Remove the ice if your skin turns bright red. This is very important. If you cannot feel pain, heat, or cold, you have a greater risk of damage to the  area. Move your toes often to reduce stiffness and swelling. Raise  (elevate) your foot above the level of your heart while you are sitting or lying down. Activity Return to your normal activities as told by your health care provider. Ask your health care provider what activities are safe for you. Do exercises as told by your health care provider. General instructions Take over-the-counter and prescription medicines only as told by your health care provider. Wear orthotics and supportive shoes as told by your health care provider. Do not wear high heels. Do not use any products that contain nicotine or tobacco. These products include cigarettes, chewing tobacco, and vaping devices, such as e-cigarettes. If you need help quitting, ask your health care provider. If you are overweight, work with your health care provider to reach a healthy weight. Keep all follow-up visits. This is important. How is this prevented? Give your body time to rest between periods of activity. Wear comfortable and supportive shoes during athletic activity. Maintain a healthy weight. Manage long-term (chronic) health conditions, such as diabetes, high blood pressure, or blood vessel disease. Contact a health care provider if: Your symptoms do not improve or they get worse. You have concerns about your orthotics and shoes. Summary Heel pad atrophy is a cause of heel pain. This condition is caused by the gradual breakdown of the fat pad under the heel bone. Treatment may include avoiding activities that cause pain, using an orthotic, and maintaining a healthy weight. This information is not intended to replace advice given to you by your health care provider. Make sure you discuss any questions you have with your health care provider. Document Revised: 03/03/2021 Document Reviewed: 03/03/2021 Elsevier Patient Education  2024 [employer] Ankle Sprain  An ankle sprain is a stretch or tear in a ligament in your ankle. Ligaments are tissues that connect bones to each other.  An ankle  sprain can happen when: The ankle rolls outward. This is called an inversion sprain. The ankle rolls inward. This is called an eversion sprain. What are the causes? An ankle sprain is caused by rolling or twisting your ankle. What increases the risk? You are more likely to get an ankle sprain if you play sports. What are the signs or symptoms?  Pain in your ankle. Swelling. Bruising. Bruises may form right after you sprain your ankle or 1-2 days later. Trouble standing or walking. How is this treated? An ankle sprain may be treated with: A brace or splint. This is used to keep the ankle from moving until it heals. An elastic bandage (dressing). This is used to support the ankle. Crutches. Pain medicine. Surgery. This may be needed if the sprain is very bad. Physical therapy. This can help you move your ankle better. Follow these instructions at home: If you have a brace or a splint that can be taken off: Wear the brace or splint as told by your doctor. Take it off only as told by your doctor. Check the skin around the brace or splint every day. Tell your doctor if you see problems. Loosen the brace or splint if your toes: Tingle. Become numb. Turn cold and blue. Keep the brace or splint clean and dry. If the brace or splint is not waterproof: Do not let it get wet. Cover it with a watertight covering when you take a bath or a shower. If you have an elastic dressing: Take it off to shower or bathe. Adjust it if it feels too tight. Loosen the  dressing if your foot: Tingles. Becomes numb. Turns cold and blue. Managing pain, stiffness, and swelling If told, put ice on the affected area. If you have a removable brace or splint, take it off as told by your doctor. Put ice in a plastic bag. Place a towel between your skin and the bag. Leave the ice on for 20 minutes, 2-3 times a day. If your skin turns bright red, take off the ice right away to prevent skin damage. The risk of  damage is higher if you cannot feel pain, heat, or cold. Move your toes often. Raise your ankle above the level of your heart while you are sitting or lying down. General instructions Take over-the-counter and prescription medicines only as told by your doctor. Do not smoke or use any products that contain nicotine or tobacco. If you need help quitting, ask your doctor. Rest your ankle. Use crutches to support your body weight. Do not use your injured leg to support your body weight until your doctor says that you can. Ask your doctor when it is safe to drive if you have a brace or splint on your ankle. Contact a doctor if: Your bruises or swelling get worse all of a sudden. Your pain does not get better after you take medicine. Get help right away if: Your foot or toes are numb or blue. You have very bad pain that gets worse. This information is not intended to replace advice given to you by your health care provider. Make sure you discuss any questions you have with your health care provider. Document Revised: 04/22/2022 Document Reviewed: 04/22/2022 Elsevier Patient Education  2024 [employer] Ankle Sprain, Phase I Rehab An ankle sprain is an injury to the tissues that connect bone to bone (ligaments) in your ankle. Ankle sprains can cause stiffness, loss of motion, and loss of strength. Ask your health care provider which exercises are safe for you. Do exercises exactly as told by your provider and adjust them as directed. It is normal to feel mild stretching, pulling, tightness, or discomfort as you do these exercises. Stop right away if you feel sudden pain or your pain gets worse. Do not begin these exercises until told by your provider. Stretching and range-of-motion exercises These exercises warm up your muscles and joints. They can improve the movement and flexibility of your lower leg and ankle. They also help to relieve pain and stiffness. Gastroc and soleus stretch This  exercise is also called a calf stretch. It stretches the muscles in the back of the lower leg. These muscles are the gastrocnemius, or gastroc, and the soleus. Sit on the floor with your left / right leg extended. Loop a belt or towel around the ball of your left / right foot. The ball of your foot is on the walking surface, right under your toes. Keep your left / right ankle and foot relaxed and keep your knee straight. Use the belt or towel to pull your foot toward you. You should feel a gentle stretch behind your calf or knee in your gastroc muscle. Hold this position for __________ seconds, then release to the starting position. Repeat the exercise with your knee bent. You can put a pillow or a rolled bath towel under your knee to support it. You should feel a stretch deep in your calf in the soleus muscle or at your Achilles tendon. Repeat __________ times. Complete this exercise __________ times a day. Ankle alphabet  Sit with your left / right  leg supported at the lower leg. Do not rest your foot on anything. Make sure your foot has room to move freely. Think of your left / right foot as a paintbrush. Move your foot to trace each letter of the alphabet in the air. Keep your hip and knee still while you trace. Make the letters as large as you can without feeling discomfort. Trace every letter from A to Z. Repeat __________ times. Complete this exercise __________ times a day. Strengthening exercises These exercises build strength and endurance in your ankle and lower leg. Endurance is the ability to use your muscles for a long time, even after they get tired. Ankle dorsiflexion  Secure a rubber exercise band or tube to an object, such as a table leg, that will stay still when the band is pulled. Secure the other end around your left / right foot. Sit on the floor facing the object, with your left / right leg extended. The band or tube should be slightly tense when your foot is  relaxed. Slowly bring your foot toward you, bringing the top of your foot toward your shin (dorsiflexion), and pulling the band tighter. Hold this position for __________ seconds. Slowly return your foot to the starting position. Repeat __________ times. Complete this exercise __________ times a day. Ankle plantar flexion  Sit on the floor with your left / right leg extended. Loop a rubber exercise tube or band around the ball of your left / right foot. The ball of your foot is on the walking surface, right under your toes. Hold the ends of the band or tube in your hands. The band or tube should be slightly tense when your foot is relaxed. Slowly point your foot and toes downward to tilt the top of your foot away from your shin (plantar flexion). Hold this position for __________ seconds. Slowly return your foot to the starting position. Repeat __________ times. Complete this exercise __________ times a day. Ankle eversion  Sit on the floor with your legs straight out in front of you. Loop a rubber exercise band or tube around the ball of your left / right foot. The ball of your foot is on the walking surface, right under your toes. Hold the ends of the band in your hands or secure the band to a stable object. The band or tube should be slightly tense when your foot is relaxed. Slowly push your foot outward, away from your other leg (eversion). Hold this position for __________ seconds. Slowly return your foot to the starting position. Repeat __________ times. Complete this exercise __________ times a day. This information is not intended to replace advice given to you by your health care provider. Make sure you discuss any questions you have with your health care provider. Document Revised: 05/13/2022 Document Reviewed: 05/13/2022 [employer] Patient Education  2024 [employer]  The above assessment and management plan was discussed with the patient. The patient verbalized understanding  of and has agreed to the management plan. Patient is aware to call the clinic if they develop any new symptoms or if symptoms persist or worsen. Patient is aware when to return to the clinic for a follow-up visit. Patient educated on when it is appropriate to go to the emergency department.   Todd Mcdiarmid [place] Thompson, DNP [facility] [address] [place], [place] [address] [phone]       [1] No Known Allergies  "

## 2024-09-05 ENCOUNTER — Other Ambulatory Visit: Payer: Self-pay | Admitting: Family Medicine

## 2024-09-05 DIAGNOSIS — I10 Essential (primary) hypertension: Secondary | ICD-10-CM

## 2024-09-19 ENCOUNTER — Ambulatory Visit: Admitting: Dermatology

## 2024-09-29 ENCOUNTER — Ambulatory Visit: Admitting: Family Medicine

## 2025-01-29 ENCOUNTER — Ambulatory Visit: Admitting: Family Medicine
